# Patient Record
Sex: Female | Born: 1935 | Race: White | Hispanic: No | Marital: Married | Smoking: Never smoker
Health system: Southern US, Community
[De-identification: ages and names within clinical notes are randomized; demographics above are authoritative.]

## PROBLEM LIST (undated history)

## (undated) DIAGNOSIS — I4891 Unspecified atrial fibrillation: Secondary | ICD-10-CM

## (undated) DIAGNOSIS — E785 Hyperlipidemia, unspecified: Secondary | ICD-10-CM

## (undated) DIAGNOSIS — K219 Gastro-esophageal reflux disease without esophagitis: Secondary | ICD-10-CM

## (undated) DIAGNOSIS — C50919 Malignant neoplasm of unspecified site of unspecified female breast: Secondary | ICD-10-CM

## (undated) HISTORY — DX: Malignant neoplasm of unspecified site of unspecified female breast: C50.919

## (undated) HISTORY — DX: Hyperlipidemia, unspecified: E78.5

## (undated) HISTORY — DX: Gastro-esophageal reflux disease without esophagitis: K21.9

## (undated) HISTORY — PX: PARTIAL HYSTERECTOMY: SHX80

## (undated) HISTORY — PX: MASTECTOMY: SHX3

## (undated) HISTORY — DX: Unspecified atrial fibrillation: I48.91

---

## 2001-12-11 ENCOUNTER — Ambulatory Visit (HOSPITAL_COMMUNITY): Admission: RE | Admit: 2001-12-11 | Discharge: 2001-12-11 | Payer: Self-pay | Admitting: Specialist

## 2002-01-13 ENCOUNTER — Ambulatory Visit (HOSPITAL_COMMUNITY): Admission: RE | Admit: 2002-01-13 | Discharge: 2002-01-13 | Payer: Self-pay | Admitting: Specialist

## 2005-07-14 ENCOUNTER — Ambulatory Visit: Payer: Self-pay | Admitting: Cardiology

## 2006-08-17 ENCOUNTER — Encounter: Payer: Self-pay | Admitting: Cardiology

## 2006-10-17 ENCOUNTER — Encounter: Payer: Self-pay | Admitting: Cardiology

## 2007-01-25 ENCOUNTER — Encounter: Payer: Self-pay | Admitting: Cardiology

## 2007-02-05 ENCOUNTER — Ambulatory Visit: Payer: Self-pay | Admitting: Cardiology

## 2007-05-07 ENCOUNTER — Encounter: Payer: Self-pay | Admitting: Cardiology

## 2007-06-11 ENCOUNTER — Encounter: Payer: Self-pay | Admitting: Physician Assistant

## 2007-06-11 ENCOUNTER — Ambulatory Visit: Payer: Self-pay | Admitting: Cardiology

## 2007-06-20 ENCOUNTER — Ambulatory Visit: Payer: Self-pay | Admitting: Cardiology

## 2007-06-26 ENCOUNTER — Ambulatory Visit: Payer: Self-pay | Admitting: Cardiology

## 2007-07-03 ENCOUNTER — Ambulatory Visit: Payer: Self-pay | Admitting: Cardiology

## 2007-07-17 ENCOUNTER — Ambulatory Visit: Payer: Self-pay | Admitting: Cardiology

## 2007-07-30 ENCOUNTER — Ambulatory Visit: Payer: Self-pay | Admitting: Cardiology

## 2007-09-17 ENCOUNTER — Ambulatory Visit: Payer: Self-pay | Admitting: Cardiology

## 2007-10-16 ENCOUNTER — Ambulatory Visit: Payer: Self-pay | Admitting: Cardiology

## 2007-11-12 ENCOUNTER — Ambulatory Visit: Payer: Self-pay | Admitting: Cardiology

## 2008-01-28 ENCOUNTER — Encounter: Payer: Self-pay | Admitting: Cardiology

## 2008-06-23 ENCOUNTER — Ambulatory Visit: Payer: Self-pay | Admitting: Cardiology

## 2008-07-07 ENCOUNTER — Ambulatory Visit: Payer: Self-pay | Admitting: Cardiology

## 2008-07-17 ENCOUNTER — Ambulatory Visit: Payer: Self-pay | Admitting: Cardiology

## 2008-08-14 ENCOUNTER — Ambulatory Visit: Payer: Self-pay | Admitting: Cardiology

## 2008-09-04 ENCOUNTER — Ambulatory Visit: Payer: Self-pay | Admitting: Cardiology

## 2008-09-21 ENCOUNTER — Encounter: Payer: Self-pay | Admitting: *Deleted

## 2008-09-24 DIAGNOSIS — I4891 Unspecified atrial fibrillation: Secondary | ICD-10-CM

## 2008-09-24 DIAGNOSIS — E785 Hyperlipidemia, unspecified: Secondary | ICD-10-CM

## 2008-09-28 ENCOUNTER — Ambulatory Visit: Payer: Self-pay | Admitting: Cardiology

## 2008-09-28 DIAGNOSIS — Z8679 Personal history of other diseases of the circulatory system: Secondary | ICD-10-CM | POA: Insufficient documentation

## 2008-09-30 ENCOUNTER — Encounter: Payer: Self-pay | Admitting: Cardiology

## 2008-10-02 ENCOUNTER — Ambulatory Visit: Payer: Self-pay | Admitting: Cardiology

## 2008-10-02 LAB — CONVERTED CEMR LAB: POC INR: 2.6

## 2008-10-08 ENCOUNTER — Encounter (INDEPENDENT_AMBULATORY_CARE_PROVIDER_SITE_OTHER): Payer: Self-pay | Admitting: *Deleted

## 2008-10-08 ENCOUNTER — Telehealth: Payer: Self-pay | Admitting: Cardiology

## 2008-10-09 ENCOUNTER — Ambulatory Visit: Payer: Self-pay | Admitting: Cardiology

## 2008-10-30 ENCOUNTER — Ambulatory Visit: Payer: Self-pay | Admitting: Cardiology

## 2008-10-30 LAB — CONVERTED CEMR LAB: POC INR: 2.6

## 2008-11-24 ENCOUNTER — Ambulatory Visit: Payer: Self-pay | Admitting: Cardiology

## 2008-11-24 ENCOUNTER — Encounter: Payer: Self-pay | Admitting: Cardiology

## 2009-07-01 ENCOUNTER — Ambulatory Visit: Payer: Self-pay | Admitting: Cardiology

## 2009-07-02 ENCOUNTER — Encounter: Payer: Self-pay | Admitting: Cardiology

## 2009-07-08 ENCOUNTER — Encounter: Payer: Self-pay | Admitting: Cardiology

## 2009-07-08 ENCOUNTER — Encounter (INDEPENDENT_AMBULATORY_CARE_PROVIDER_SITE_OTHER): Payer: Self-pay | Admitting: *Deleted

## 2009-07-16 ENCOUNTER — Ambulatory Visit: Payer: Self-pay | Admitting: Cardiology

## 2009-07-16 LAB — CONVERTED CEMR LAB: POC INR: 2.3

## 2009-08-13 ENCOUNTER — Encounter: Payer: Self-pay | Admitting: Cardiology

## 2009-08-17 ENCOUNTER — Telehealth (INDEPENDENT_AMBULATORY_CARE_PROVIDER_SITE_OTHER): Payer: Self-pay | Admitting: *Deleted

## 2009-08-24 ENCOUNTER — Ambulatory Visit: Payer: Self-pay | Admitting: Cardiology

## 2009-08-24 LAB — CONVERTED CEMR LAB: POC INR: 2.5

## 2009-09-21 ENCOUNTER — Ambulatory Visit: Payer: Self-pay | Admitting: Cardiology

## 2009-09-21 LAB — CONVERTED CEMR LAB: POC INR: 2.9

## 2009-10-01 ENCOUNTER — Ambulatory Visit: Payer: Self-pay | Admitting: Cardiology

## 2009-10-12 ENCOUNTER — Ambulatory Visit: Payer: Self-pay | Admitting: Cardiology

## 2009-10-15 ENCOUNTER — Ambulatory Visit: Payer: Self-pay | Admitting: Cardiology

## 2009-10-15 ENCOUNTER — Encounter: Payer: Self-pay | Admitting: Cardiology

## 2009-10-19 ENCOUNTER — Ambulatory Visit: Payer: Self-pay | Admitting: Cardiology

## 2009-11-09 ENCOUNTER — Encounter: Payer: Self-pay | Admitting: Cardiology

## 2009-11-09 ENCOUNTER — Ambulatory Visit: Payer: Self-pay | Admitting: Cardiology

## 2009-11-17 ENCOUNTER — Encounter: Payer: Self-pay | Admitting: Physician Assistant

## 2009-11-17 ENCOUNTER — Ambulatory Visit: Payer: Self-pay | Admitting: Cardiology

## 2009-11-17 DIAGNOSIS — F411 Generalized anxiety disorder: Secondary | ICD-10-CM | POA: Insufficient documentation

## 2009-11-23 ENCOUNTER — Telehealth: Payer: Self-pay | Admitting: Physician Assistant

## 2009-11-25 ENCOUNTER — Encounter: Payer: Self-pay | Admitting: Cardiology

## 2010-03-08 NOTE — Assessment & Plan Note (Signed)
Summary: atrial fib  --agh   Visit Type:  Follow-up Primary Provider:  Dr. Pleas Koch  CC:  Atrial Fibrillation.  History of Present Illness: The H. and was added to my schedule today to discuss atrial fibrillation. She thinks she was in this rhythm most of the weekend. She fell it this morning but upon arrival here her EKG is sinus rhythm. She felt her heart rate up into the 120s and it was irregular. She feels uncomfortable with this and slightly lightheaded. She has not had chest pressure, neck or arm discomfort. She has not had any new shortness of breath, PND or orthopnea. She called the primary doctor on call and did take an extra Cardizem. This is perhaps the third or fourth episode in the last several months and this year she has had more than she has had a long time.  Preventive Screening-Counseling & Management  Alcohol-Tobacco     Smoking Status: quit     Year Quit: 1958  Current Medications (verified): 1)  Mag-Ox 400 400 Mg Tabs (Magnesium Oxide) .... Two Times A Day 2)  Benefiber  Powd (Wheat Dextrin) .Marland Kitchen.. 1 Tbsp Once A Day 3)  Ra Calcium/iron/vitamin D 600-18-125 Mg-Mg-Unit Tabs (Calcium-Vitamin D-Iron) .... Two Times A Day 4)  Multivitamins   Tabs (Multiple Vitamin) .... Once Daily 5)  Glucosamine-Chondroitin  Caps (Glucosamine-Chondroit-Vit C-Mn) .... Two Times A Day 6)  Biotin 1000 Mcg Tabs (Biotin) .... Two Times A Day 7)  Fish Oil 1000 Mg Caps (Omega-3 Fatty Acids) .... Two Tablets in The Morning / Two Tablets in The Evening 8)  Cortizone-10 1 % Oint (Hydrocortisone) .... Two Times A Day 9)  Clobetasol Propionate 0.05 % Crea (Clobetasol Propionate) .... Apply Topically Weekly 10)  Amitriptyline Hcl 10 Mg Tabs (Amitriptyline Hcl) .... At Bedtime 11)  Warfarin Sodium 5 Mg Tabs (Warfarin Sodium) .... Use As Directed By Anticoagulation Clinic 12)  Atenolol 50 Mg Tabs (Atenolol) .... Take One Tablet By Mouth Twice A Day 13)  Zocor 20 Mg Tabs (Simvastatin) .... Take 1/2 Tab At  Bedtime 14)  Diazepam 2 Mg Tabs (Diazepam) .... Take 1 Tablet By Mouth Once A Day As Needed 15)  Diltiazem Hcl 30 Mg Tabs (Diltiazem Hcl) .... Take One Tab If Needed and Repeat in 1 Hour If Needed 16)  Chlorhexidine Gluconate 0.12 % Soln (Chlorhexidine Gluconate) .... Take As Directed 17)  Cardizem Cd 120 Mg Xr24h-Cap (Diltiazem Hcl Coated Beads) .... Take 1 Tablet By Mouth Once A Day(Patient Hasn't Started This) 18)  Cardizem Cd 120 Mg Xr24h-Cap (Diltiazem Hcl Coated Beads) .... Take 1 Tablet By Mouth Once A Day  Allergies (verified): 1)  ! Epinephrine 2)  ! Dicyclomine 3)  Sulfa 4)  * Adrenalin (Epinephrine Hcl) 5)  * Oscal - Calcium 6)  Sulfamethoxazole (Sulfamethoxazole) 7)  * Oscal  Comments:  Nurse/Medical Assistant: The patient's medication list and allergies were reviewed with the patient and were updated in the Medication and Allergy Lists.  Past History:  Past Medical History: Reviewed history from 09/24/2008 and no changes required. HYPERLIPIDEMIA-MIXED (ICD-272.4) ATRIAL FIBRILLATION (ICD-427.31) GERD  Review of Systems       As stated in the HPI and negative for all other systems.   Vital Signs:  Patient profile:   77 year old female Height:      67.5 inches Weight:      164 pounds O2 Sat:      98 % on Room air Pulse (ortho):   71 / minute BP  sitting:   118 / 78  (left arm) Cuff size:   regular  Vitals Entered By: Georgina Peer (October 12, 2009 9:37 AM)  O2 Flow:  Room air  Physical Exam  General:  Well developed, well nourished, in no acute distress. Head:  normocephalic and atraumatic Eyes:  PERRLA/EOM intact; conjunctiva and lids normal. Mouth:  Teeth, gums and palate normal. Oral mucosa normal. Neck:  Neck supple, no JVD. No masses, thyromegaly or abnormal cervical nodes. Chest Wall:  no deformities or breast masses noted Lungs:  Clear bilaterally to auscultation and percussion. Abdomen:  Bowel sounds positive; abdomen soft and non-tender  without masses, organomegaly, or hernias noted. No hepatosplenomegaly. Msk:  Back normal, normal gait. Muscle strength and tone normal. Extremities:  No clubbing or cyanosis. Neurologic:  Alert and oriented x 3. Skin:  Intact without lesions or rashes. Cervical Nodes:  no significant adenopathy Inguinal Nodes:  no significant adenopathy Psych:  Normal affect.   Detailed Cardiovascular Exam  Neck    Carotids: Carotids full and equal bilaterally without bruits.      Neck Veins: Normal, no JVD.    Heart    Inspection: no deformities or lifts noted.      Palpation: normal PMI with no thrills palpable.      Auscultation: regular rate and rhythm, S1, S2 without murmurs, rubs, gallops, or clicks.    Vascular    Abdominal Aorta: no palpable masses, pulsations, or audible bruits.      Femoral Pulses: normal femoral pulses bilaterally.      Pedal Pulses: normal pedal pulses bilaterally.      Radial Pulses: normal radial pulses bilaterally.      Peripheral Circulation: no clubbing, cyanosis, or edema noted with normal capillary refill.     EKG  Procedure date:  10/12/2009  Findings:      Atrial fibrillation, axis within normal limits, intervals within normal limits, no acute ST-T wave changes  Impression & Recommendations:  Problem # 1:  ATRIAL FIBRILLATION (ICD-427.31) We had a long discussion about this. The next decision point may need to be whether the patient wants to start an antiarrhythmic. Her TSH was done in May and I reviewed this it was normal. She has not had a nuclear study or echocardiogram since 2008 here.  She doesn't think she's had any in [place].  I will arrange for an echocardiogram. I will arrange for her to come back and see Dr. Dannielle Burn before she goes back to [place]. We discussed strategies of taking it next to Cardizem and perhaps a Valium when she's having the symptoms. She remains on anticoagulation. Orders: EKG w/ Interpretation (93000)  Problem # 2:   TRANSIENT ISCHEMIC ATTACKS, HX OF (ICD-V12.50) She has had no recurrent neurologic symptoms and remains on anticoagulation.  Other Orders: 2-D Echocardiogram (2D Echo)  Patient Instructions: 1)  Follow up with Dr. Lutricia Feil on Wed, November 17, 2009 at 9:30 am. 2)  Your physician has requested that you have an echocardiogram.  Echocardiography is a painless test that uses sound waves to create images of your heart. It provides your doctor with information about the size and shape of your heart and how well your heart's chambers and valves are working.  This procedure takes approximately one hour. There are no restrictions for this procedure.

## 2010-03-08 NOTE — Progress Notes (Signed)
Summary: [facility]  Phone Note Call from Patient Call back at [phone]   Summary of Call: Pt states she recently saw Mr. Jacqualine Mau. She states they discussed increasing her diltiazem but she wanted to hold off on this. She states since then she went to the beach and started having more frequent episodes of irregular heartbeats. She states she wants to talk to Gene about this before she goes to [place]. She said that he told her to call anytiime she has a problem.  Initial call taken by: Gurney Maxin, RN, BSN,  November 23, 2009 4:26 PM  Follow-up for Phone Call        I called pt...she agreed to increase Cardizem to 180 mg once daily. therefore, please call in new prescription for this, #30, 6 refills, as well as new prescription for Diltiazem 30 mg as needed, #30, 3 refills...also, she would like [place] to cancel the prescription for Cardizem 120 mg once daily, which apparently has already been filled.   thanks Follow-up by: Maryjane Hurter, PA-C,  November 24, 2009 12:50 PM     Appended Document: [facility] Left message to call back on voicemail.  Appended Document: [facility] Pt would like prescription sent to [facility] in [place]. She also needs refill for 30mg  as needed tablets   Clinical Lists Changes  Medications: Changed medication from CARDIZEM CD 120 MG XR24H-CAP (DILTIAZEM HCL COATED BEADS) Take 1 tablet by mouth once a day to DILTIAZEM HCL ER BEADS 180 MG XR24H-CAP (DILTIAZEM HCL ER BEADS) Take one capsule by mouth daily - Signed Rx of DILTIAZEM HCL 30 MG TABS (DILTIAZEM HCL) take one tab if needed and repeat in 1 hour if needed;  #30 x 2;  Signed;  Entered by: Gurney Maxin, RN, BSN;  Authorized by: Maryjane Hurter, PA-C;  Method used: Electronically to [facility] [place] [address] [place], [place], [place]  [address], Ph: [phone], Fax: [phone] Rx of DILTIAZEM HCL ER BEADS 180 MG XR24H-CAP (DILTIAZEM HCL ER BEADS) Take one capsule by mouth daily;  #90 x 3;  Signed;   Entered by: Gurney Maxin, RN, BSN;  Authorized by: Maryjane Hurter, PA-C;  Method used: Electronically to [facility] [place] [address] [place], [place], [place]  [address], Ph: [phone], Fax: [phone]    Prescriptions: DILTIAZEM HCL ER BEADS 180 MG XR24H-CAP (DILTIAZEM HCL ER BEADS) Take one capsule by mouth daily  #90 x 3   Entered by:   Gurney Maxin, RN, BSN   Authorized by:   Maryjane Hurter, PA-C   Signed by:   Gurney Maxin, RN, BSN on 11/25/2009   Method used:   Electronically to        [facility] (retail)       [address], [place]  [address]       Ph: [phone]       Fax: [phone]   RxID:   [id number] DILTIAZEM HCL 30 MG TABS (DILTIAZEM HCL) take one tab if needed and repeat in 1 hour if needed  #30 x 2   Entered by:   Gurney Maxin, RN, BSN   Authorized by:   Maryjane Hurter, PA-C   Signed by:   Gurney Maxin, RN, BSN on 11/25/2009   Method used:   Electronically to        [facility] [place]* (retail)  [place] [address]       [place], [place]  [address]       Ph: [phone]       Fax: [phone]   RxID:   [id number]

## 2010-03-08 NOTE — Medication Information (Signed)
Summary: ccr-lr  Anticoagulant Therapy  Managed by: Edrick Oh, RN PCP: Dr. Nadyne Coombes MD: Domenic Polite MD, Mikeal Hawthorne Indication 1: Atrial Fibrillation (ICD-427.31) Lab Used: [place] Site: [facility]  Dietary changes: no    Health status changes: no    Bleeding/hemorrhagic complications: no    Recent/future hospitalizations: no    Any changes in medication regimen? no    Recent/future dental: no  Any missed doses?: yes     Details: missed 2 doses  Is patient compliant with meds? yes       Allergies: 1)  ! Epinephrine 2)  ! Dicyclomine 3)  Sulfa 4)  * Adrenalin (Epinephrine Hcl) 5)  * Oscal - Calcium 6)  Sulfamethoxazole (Sulfamethoxazole) 7)  * Oscal  Anticoagulation Management History:      The patient is taking warfarin and comes in today for a routine follow up visit.  Positive risk factors for bleeding include an age of 3 years or older.  The bleeding index is 'intermediate risk'.  Positive CHADS2 values include Age > 17 years old.  The start date was 06/11/2007.  Anticoagulation responsible provider: Domenic Polite MD, Mikeal Hawthorne.  Exp: 10/11.    Anticoagulation Management Assessment/Plan:      The patient's current anticoagulation dose is Warfarin sodium 5 mg tabs: Use as directed by Anticoagulation Clinic.  The target INR is 2 - 3.  The next INR is due 11/09/2009.  Anticoagulation instructions were given to patient.  Results were reviewed/authorized by Edrick Oh, RN.         Prior Anticoagulation Instructions: INR 2.9 Continue coumadin 5mg  once daily except 2.5mg  on Tuesdays and Fridays  Current Anticoagulation Instructions: INR 1.7 Take 1 1/2 tablets tonight then resume 1 tablet once daily except 1/2 tablet on Tuesdays and Fridays

## 2010-03-08 NOTE — Letter (Signed)
Summary: Risk analyst at [facility]. [address]   [place], [place] [address]   Phone: [phone]  Fax: [phone]        July 08, 2009 MRN: [id number]    Marissa Garza [place],   [address]    Dear [name],  Your test ordered by Rande Lawman has been reviewed by your physician (or physician assistant) and was found to be normal or stable. Your physician (or physician assistant) felt no changes were needed at this time.  ____ Echocardiogram  ____ Cardiac Stress Test  __X__ Lab Work  ____ Peripheral vascular study of arms, legs or neck  ____ CT scan or X-ray  ____ Lung or Breathing test  ____ Other:   Thank you.   Gurney Maxin, RN, BSN    Bryon Lions, M.D., F.A.C.C. Maceo Pro, M.D., F.A.C.C. Cammy Copa, M.D., F.A.C.C. Vonda Antigua, M.D., F.A.C.C. Vita Barley, M.D., F.A.C.C. Mare Ferrari, M.D., F.A.C.C. Emi Belfast, PA-C

## 2010-03-08 NOTE — Progress Notes (Signed)
Summary: AF episode x 2        Phone Note Call from Patient   Summary of Call: Had one episode of AF on Friday and again last p.m.  Episode on Friday - did short acting atenolol x 2, one hour apart - did not help.  She then took her regular daily Atenolol 2 hours later - at 2:00a.m./ 6 a..m  and 12 noon before feeling normal again .   Episode last pm - took regular dosage of Atenolol and that helped.   Stated she did go out to [place] where she had coffee on Friday, but thought she had decaf.    Lovina Reach, LPN  July 12, 624THL [id number] AM        Follow-up for Phone Call        should be short acting cardizem, not atenolol. Suggest either she adds low dose Cardizem CD 12mg  by mouth qdaily OR start digoxin 0.25 mg Po qdaily with dig level in 10 days.  Follow-up by: Terald Sleeper, MD, [facility],  August 17, 2009 4:10 PM  Additional Follow-up for Phone Call Additional follow up Details #1::        Clarify above.  You do mean Cardizem CD "120mg " ? Lovina Reach, LPN  July 13, 624THL 624THL AM   Spoke with Pamala Hurry this morning, she did use short acting Cardizem, not Atenolol.  Stated that it did not work though.   Lovina Reach, LPN  July 14, 624THL 624THL AM     Additional Follow-up for Phone Call Additional follow up Details #2::    Yes add low dose Cardizem CD 120 mg Po qdaily.  Follow-up by: Terald Sleeper, MD, [facility],  August 20, 2009 11:26 AM  Additional Follow-up for Phone Call Additional follow up Details #3:: Details for Additional Follow-up Action Taken: Left message to return call.  Lovina Reach, LPN  July 15, 624THL 624THL AM   Patient notified.   Will send new med to [facility].  Pt. also wanted to be seen for f/u before she goes back to [place].  OV scheduled for 8/26.  Patient verbalized understanding.   New/Updated Medications: CARDIZEM CD 120 MG XR24H-CAP (DILTIAZEM HCL COATED BEADS) Take 1 tablet by mouth once a day Prescriptions: CARDIZEM CD 120 MG XR24H-CAP (DILTIAZEM HCL COATED BEADS) Take 1  tablet by mouth once a day  #30 x 6   Entered by:   Lovina Reach, LPN   Authorized by:   Terald Sleeper, MD, [facility]   Signed by:   Lovina Reach, LPN on [id number]   Method used:   Electronically to        [facility]* (retail)       [address]       [place], [place]  [address]       Ph: [phone]       Fax: [phone]   RxID:   [id number]

## 2010-03-08 NOTE — Medication Information (Signed)
Summary: ccr-lr  Anticoagulant Therapy  Managed by: Edrick Oh, RN PCP: none Supervising MD: Percival Spanish MD, Jeneen Rinks Indication 1: Atrial Fibrillation (ICD-427.31) Lab Used: [place] Site: [place] INR POC 2.9  Dietary changes: no    Health status changes: no    Bleeding/hemorrhagic complications: no    Recent/future hospitalizations: no    Any changes in medication regimen? no    Recent/future dental: no  Any missed doses?: no       Is patient compliant with meds? yes       Allergies: 1)  ! Epinephrine 2)  ! Dicyclomine 3)  Sulfa 4)  * Adrenalin (Epinephrine Hcl) 5)  * Oscal - Calcium 6)  Sulfamethoxazole (Sulfamethoxazole) 7)  * Oscal  Anticoagulation Management History:      The patient is taking warfarin and comes in today for a routine follow up visit.  Positive risk factors for bleeding include an age of 34 years or older.  The bleeding index is 'intermediate risk'.  Positive CHADS2 values include Age > 62 years old.  The start date was 06/11/2007.  Anticoagulation responsible provider: Percival Spanish MD, Jeneen Rinks.  INR POC: 2.9.  Cuvette Lot#: [id number].  Exp: 10/11.    Anticoagulation Management Assessment/Plan:      The patient's current anticoagulation dose is Warfarin sodium 5 mg tabs: Use as directed by Anticoagulation Clinic.  The target INR is 2 - 3.  The next INR is due 10/19/2009.  Anticoagulation instructions were given to patient.  Results were reviewed/authorized by Edrick Oh, RN.  She was notified by Edrick Oh RN.         Prior Anticoagulation Instructions: INR 2.5 Contiinue coumadin 5mg  once daily except 2.5mg  on Tuesdays and Fridays  Current Anticoagulation Instructions: INR 2.9 Continue coumadin 5mg  once daily except 2.5mg  on Tuesdays and Fridays

## 2010-03-08 NOTE — Miscellaneous (Signed)
Summary: rx - diazepam  Clinical Lists Changes  Medications: Rx of DIAZEPAM 2 MG TABS (DIAZEPAM) Take 1 tablet by mouth once a day as needed;  #90 x 0;  Signed;  Entered by: Lovina Reach, LPN;  Authorized by: Terald Sleeper, MD, [facility];  Method used: Print then Give to Patient    Prescriptions: DIAZEPAM 2 MG TABS (DIAZEPAM) Take 1 tablet by mouth once a day as needed  #90 x 0   Entered by:   Lovina Reach, LPN   Authorized by:   Terald Sleeper, MD, [facility]   Signed by:   Lovina Reach, LPN on [id number]   Method used:   Print then Give to Patient   RxID:   [id number]

## 2010-03-08 NOTE — Assessment & Plan Note (Signed)
Summary: 6 MO FU & CCR   Visit Type:  Follow-up Primary Provider:  none   History of Present Illness: the patient is a 77 year old female with a history of paroxysmal atrial fibrillation.  The patient is on chronic Coumadin therapy.  She is very functional.  She has a prior history of CVA with no residual neurological deficits.  Since her last office visit she reports a couple of episodes off what appears to be short runs of atrial fibrillation.  She reports experiencing a fluttering in her chest on two separate occasions to go lasting about 30 minutes.  There was no associated shortness of breath chest pain or diaphoresis.  Otherwise the patient has been doing well from a cardiovascular standpoint with no reports of presyncope or syncope.  The patient did not have any recent lab work done and she is due for PT/INR in the office today.  Preventive Screening-Counseling & Management  Alcohol-Tobacco     Smoking Status: quit     Year Quit: 1958  Current Medications (verified): 1)  Mag-Ox 400 400 Mg Tabs (Magnesium Oxide) .... Two Times A Day 2)  Benefiber  Powd (Wheat Dextrin) .Marland Kitchen.. 1 Tbsp Once A Day 3)  Ra Calcium/iron/vitamin D 600-18-125 Mg-Mg-Unit Tabs (Calcium-Vitamin D-Iron) .... Two Times A Day 4)  Multivitamins   Tabs (Multiple Vitamin) .... Once Daily 5)  Glucosamine-Chondroitin  Caps (Glucosamine-Chondroit-Vit C-Mn) .... Two Times A Day 6)  Biotin 1000 Mcg Tabs (Biotin) .... Two Times A Day 7)  Fish Oil 1000 Mg Caps (Omega-3 Fatty Acids) .... Two Tablets in The Morning / Two Tablets in The Evening 8)  Acidophilus  Caps (Lactobacillus) .... Once Daily 9)  Cortizone-10 1 % Oint (Hydrocortisone) .... Two Times A Day 10)  Clobetasol Propionate 0.05 % Crea (Clobetasol Propionate) .... Twice A Week 11)  Premarin 0.625 Mg/gm Crea (Estrogens, Conjugated) .... Weekly 12)  Amitriptyline Hcl 10 Mg Tabs (Amitriptyline Hcl) .... At Bedtime 13)  Warfarin Sodium 5 Mg Tabs (Warfarin Sodium)  .... Use As Directed By Anticoagulation Clinic 14)  Atenolol 50 Mg Tabs (Atenolol) .... Take One Tablet By Mouth Twice A Day 15)  Zocor 20 Mg Tabs (Simvastatin) .... Take 1 Tablet By Mouth Once A Day 16)  Diazepam 2 Mg Tabs (Diazepam) .... Take 1 Tablet By Mouth Once A Day As Needed 17)  Diltiazem Hcl 30 Mg Tabs (Diltiazem Hcl) .... Take One Tab If Needed and Repeat in 1 Hour If Needed 18)  Chlorhexidine Gluconate 0.12 % Soln (Chlorhexidine Gluconate) .... Take As Directed  Allergies: 1)  ! Epinephrine 2)  ! Dicyclomine 3)  Sulfa 4)  * Adrenalin (Epinephrine Hcl) 5)  * Oscal - Calcium 6)  Sulfamethoxazole (Sulfamethoxazole) 7)  * Oscal  Comments:  Nurse/Medical Assistant: The patient's medications and allergies were reviewed with the patient and were updated in the Medication and Allergy Lists. List reviewed.  Past History:  Past Medical History: Last updated: 2008/10/13 HYPERLIPIDEMIA-MIXED (ICD-272.4) ATRIAL FIBRILLATION (ICD-427.31) GERD  Family History: Last updated: 2008-10-13 Pts. mother died of heart attack at age 12. Father died of stroke at age 57. One of the pts. brother died at age 56 of unknown reasons. Family History of Coronary Artery Disease:  Family History of CVA or Stroke:  Family History of Sudden Cardiac Death:   Social History: Last updated: 10-13-08 she has four children, one of whom is deceased from unknown reasons. Pt. lives half of the year in [place], [place] and the other half here in [place].  Retired  Married  Tobacco Use - No.  Alcohol Use - no  Risk Factors: Smoking Status: quit (07/01/2009)  Social History: Smoking Status:  quit  Review of Systems       The patient complains of palpitations.  The patient denies fatigue, malaise, fever, weight gain/loss, vision loss, decreased hearing, hoarseness, chest pain, shortness of breath, prolonged cough, wheezing, sleep apnea, coughing up blood, abdominal pain, blood in stool, nausea,  vomiting, diarrhea, heartburn, incontinence, blood in urine, muscle weakness, joint pain, leg swelling, rash, skin lesions, headache, fainting, dizziness, depression, anxiety, enlarged lymph nodes, easy bruising or bleeding, and environmental allergies.    Vital Signs:  Patient profile:   77 year old female Height:      67.5 inches Weight:      163 pounds BMI:     25.24 O2 Sat:      98 % Pulse rate:   66 / minute BP sitting:   119 / 73  (left arm) Cuff size:   regular  Vitals Entered By: Georgina Peer (Jul 01, 2009 3:07 PM)  Nutrition Counseling: Patient's BMI is greater than 25 and therefore counseled on weight management options.  Physical Exam  Additional Exam:  General: Well-developed, well-nourished in no distress head: Normocephalic and atraumatic eyes PERRLA/EOMI intact, conjunctiva and lids normal nose: No deformity or lesions mouth normal dentition, normal posterior pharynx neck: Supple, no JVD.  No masses, thyromegaly or abnormal cervical nodes lungs: Normal breath sounds bilaterally without wheezing.  Normal percussion heart: regular rate and rhythm with normal S1 and S2, no S3 or S4.  PMI is normal.  No pathological murmurs abdomen: Normal bowel sounds, abdomen is soft and nontender without masses, organomegaly or hernias noted.  No hepatosplenomegaly musculoskeletal: Back normal, normal gait muscle strength and tone normal pulsus: Pulse is normal in all 4 extremities Extremities: No peripheral pitting edema neurologic: Alert and oriented x 3 skin: Intact without lesions or rashes cervical nodes: No significant adenopathy psychologic: Normal affect    Impression & Recommendations:  Problem # 1:  COUMADIN THERAPY (ICD-V58.61) PT/INR will be checked Orders: T-CBC No Diff [id number]) T-Comprehensive Metabolic Panel (A999333) T-Lipid Profile [id number]) T-TSH [id number]) T-Protime, Auto [id number])  Problem # 2:  TRANSIENT ISCHEMIC ATTACKS, HX OF  (ICD-V12.50) no recurrence the patient will need to stay on lifelong Coumadin in the setting of her chronic paroxysmal atrial fibrillation Orders: T-CBC No Diff [id number]) T-Comprehensive Metabolic Panel (A999333) T-Lipid Profile [id number]) T-TSH [id number])  Problem # 3:  HYPERLIPIDEMIA-MIXED (ICD-272.4) lipid panel and liver function studies will be ordered Her updated medication list for this problem includes:    Zocor 20 Mg Tabs (Simvastatin) .Marland Kitchen... Take 1 tablet by mouth once a day  Orders: T-CBC No Diff [id number]) T-Comprehensive Metabolic Panel (A999333) T-Lipid Profile [id number]) T-TSH [id number])  Problem # 4:  ATRIAL FIBRILLATION (ICD-427.31) the patient appears to have had a couple of episodes of rapid atrial fibrillation.  I have given the patient a prescription for p.r.n. short-acting diltiazem 30 mg p.o. x 1 to be used as needed The following medications were removed from the medication list:    Atenolol 50 Mg Tabs (Atenolol) .Marland Kitchen... Take one tablet by mouth as needed for palpitations    Coumadin 5 Mg Tabs (Warfarin sodium) .Marland Kitchen... Take 1-2 tablet by mouth as directed Her updated medication list for this problem includes:    Warfarin Sodium 5 Mg Tabs (Warfarin sodium) ..... Use as directed by anticoagulation clinic    Atenolol 50 Mg Tabs (Atenolol) .Marland KitchenMarland KitchenMarland KitchenMarland Kitchen  Take one tablet by mouth twice a day  Orders: T-CBC No Diff [id number]) T-Comprehensive Metabolic Panel (A999333) T-Lipid Profile [id number]) T-TSH [id number]) T-Protime, Auto [id number])  Patient Instructions: 1)  Labs 2)  Diltiazem 30mg  x 1 if needed.  May repeat in 1 hour if needed.   3)  Follow up in  6 months  Prescriptions: CHLORHEXIDINE GLUCONATE 0.12 % SOLN (CHLORHEXIDINE GLUCONATE) take as directed  #1 bottle x 1   Entered by:   Lovina Reach, LPN   Authorized by:   Terald Sleeper, MD, [facility]   Signed by:   Lovina Reach, LPN on [id number]   Method used:   Electronically to         [facility] (retail)       [address], [place]  [address]       Ph: [phone]       Fax: [phone]   RxID:   [id number] DIAZEPAM 2 MG TABS (DIAZEPAM) Take 1 tablet by mouth once a day as needed  #90 x 0   Entered by:   Lovina Reach, LPN   Authorized by:   Terald Sleeper, MD, [facility]   Signed by:   Lovina Reach, LPN on [id number]   Method used:   Print then Give to Patient   RxID:   [id number] AMITRIPTYLINE HCL 10 MG TABS (AMITRIPTYLINE HCL) at bedtime  #90 x 0   Entered by:   Lovina Reach, LPN   Authorized by:   Terald Sleeper, MD, [facility]   Signed by:   Lovina Reach, LPN on [id number]   Method used:   Faxed to ...       [facility]             , [place]         Ph:        Fax: [phone]   [id number] PREMARIN 0.625 MG/GM CREA (ESTROGENS, CONJUGATED) weekly  #6 x 1   Entered by:   Lovina Reach, LPN   Authorized by:   Terald Sleeper, MD, [facility]   Signed by:   Lovina Reach, LPN on [id number]   Method used:   Electronically to        [facility]* (retail)       [address], [place]  [address]       Ph: [phone]       Fax: [phone]   RxID:   [id number] WARFARIN SODIUM 5 MG TABS (WARFARIN SODIUM) Use as directed by Anticoagulation Clinic  #90 x 0   Entered by:   Lovina Reach, LPN   Authorized by:   Terald Sleeper, MD, [facility]   Signed by:   Lovina Reach, LPN on [id number]   Method used:   Electronically to        [facility] (retail)       [address],   [address]       Ph: [phone]       Fax: [phone]   RxID:   [id number] ATENOLOL 50 MG TABS (ATENOLOL) Take one tablet by mouth twice a day  #180 x 3   Entered by:   Lovina Reach,  LPN   Authorized by:   Terald Sleeper, MD, [facility]   Signed by:   Lovina Reach, LPN on [id number]   Method used:   Faxed to ...       [facility]order)             , [place]         Ph:        Fax: [phone]   RxID:   [id number] ZOCOR 20 MG TABS (SIMVASTATIN) Take 1 tablet by mouth once a day  #90 x 3   Entered by:   Lovina Reach, LPN   Authorized by:   Terald Sleeper, MD, [facility]   Signed by:   Lovina Reach, LPN on [id number]   Method used:   Faxed to ...       [facility]order)             , [place]         Ph:        Fax: [phone]   [id number] DILTIAZEM HCL 30 MG TABS (DILTIAZEM HCL) take one tab if needed and repeat in 1 hour if needed  #30 x 1   Entered by:   Lovina Reach, LPN   Authorized by:   Terald Sleeper, MD, [facility]   Signed by:   Lovina Reach, LPN on [id number]   Method used:   Electronically to        [facility] (retail)       [address]       [place], [place]  [address]       Ph: [phone]       Fax: [phone]   RxID:   [id number]

## 2010-03-08 NOTE — Medication Information (Signed)
Summary: RX Folder/ [facility]  RX Folder/ [facility]   Imported By: Bartholomew Boards 11/29/2009 16:09:12  _____________________________________________________________________  External Attachment:    Type:   Image     Comment:   External Document

## 2010-03-08 NOTE — Miscellaneous (Signed)
Summary: rx - prosthetic bra  Clinical Lists Changes  Medications: Added new medication of * PROSTHETIC BRA use as directed - Signed Rx of PROSTHETIC BRA use as directed;  #2 x 0;  Signed;  Entered by: Lovina Reach, LPN;  Authorized by: Terald Sleeper, MD, [facility];  Method used: Faxed to [facility] [place], [place], [address], [place]  [address], Ph: [phone], Fax: [phone]    Prescriptions: PROSTHETIC BRA use as directed  #2 x 0   Entered by:   Lovina Reach, LPN   Authorized by:   Terald Sleeper, MD, [facility]   Signed by:   Lovina Reach, LPN on [id number]   Method used:   Faxed to ...       [facility] (retail)       [place] [address]       [place],   [address]       Ph: [phone]       Fax: [phone]   RxID:   [id number]

## 2010-03-08 NOTE — Assessment & Plan Note (Signed)
Summary: rov --agh   Visit Type:  Follow-up Primary Duaa Stelzner:  [place]   History of Present Illness: patient is a 77 year old female with a history of proximal atrial fibrillation.  She reports an episode some time ago of prolonged palpitations.  She took short-acting diltiazem x 2.  However this is not controlling her rapid palpitations she took some additional atenolol and ultimately his symptoms resolved.  She denied any chest pain or shortness of breath.  Apart from this single episode the patient has been doing well.  She denies any orthopnea PND presyncope or syncope.  Her EKG today in the office is within normal limits.  She had laboratory work done with renal function stable and normal TSH and normal CBC.  Her LDL is 88-mg percent triglycerides 107 total cholesterol 156.  Liver function tests were within normal limits  Preventive Screening-Counseling & Management  Alcohol-Tobacco     Smoking Status: quit     Year Quit: 1958  Current Medications (verified): 1)  Mag-Ox 400 400 Mg Tabs (Magnesium Oxide) .... Two Times A Day 2)  Benefiber  Powd (Wheat Dextrin) .Marland Kitchen.. 1 Tbsp Once A Day 3)  Ra Calcium/iron/vitamin D 600-18-125 Mg-Mg-Unit Tabs (Calcium-Vitamin D-Iron) .... Two Times A Day 4)  Multivitamins   Tabs (Multiple Vitamin) .... Once Daily 5)  Glucosamine-Chondroitin  Caps (Glucosamine-Chondroit-Vit C-Mn) .... Two Times A Day 6)  Biotin 1000 Mcg Tabs (Biotin) .... Two Times A Day 7)  Fish Oil 1000 Mg Caps (Omega-3 Fatty Acids) .... Two Tablets in The Morning / Two Tablets in The Evening 8)  Cortizone-10 1 % Oint (Hydrocortisone) .... Two Times A Day 9)  Clobetasol Propionate 0.05 % Crea (Clobetasol Propionate) .... Apply Topically Weekly 10)  Amitriptyline Hcl 10 Mg Tabs (Amitriptyline Hcl) .... At Bedtime 11)  Warfarin Sodium 5 Mg Tabs (Warfarin Sodium) .... Use As Directed By Anticoagulation Clinic 12)  Atenolol 50 Mg Tabs (Atenolol) .... Take One Tablet By Mouth Twice A Day 13)   Zocor 20 Mg Tabs (Simvastatin) .... Take 1/2 Tab At Bedtime 14)  Diazepam 2 Mg Tabs (Diazepam) .... Take 1 Tablet By Mouth Once A Day As Needed 15)  Diltiazem Hcl 30 Mg Tabs (Diltiazem Hcl) .... Take One Tab If Needed and Repeat in 1 Hour If Needed 16)  Chlorhexidine Gluconate 0.12 % Soln (Chlorhexidine Gluconate) .... Take As Directed 17)  Cardizem Cd 120 Mg Xr24h-Cap (Diltiazem Hcl Coated Beads) .... Take 1 Tablet By Mouth Once A Day(Patient Hasn't Started This) 18)  Cardizem Cd 120 Mg Xr24h-Cap (Diltiazem Hcl Coated Beads) .... Take 1 Tablet By Mouth Once A Day  Allergies (verified): 1)  ! Epinephrine 2)  ! Dicyclomine 3)  Sulfa 4)  * Adrenalin (Epinephrine Hcl) 5)  * Oscal - Calcium 6)  Sulfamethoxazole (Sulfamethoxazole) 7)  * Oscal  Comments:  Nurse/Medical Assistant: The patient's medication list and allergies were reviewed with the patient and were updated in the Medication and Allergy Lists.  Past History:  Past Medical History: Last updated: October 11, 2008 HYPERLIPIDEMIA-MIXED (ICD-272.4) ATRIAL FIBRILLATION (ICD-427.31) GERD  Family History: Last updated: 10/11/08 Pts. mother died of heart attack at age 32. Father died of stroke at age 27. One of the pts. brother died at age 61 of unknown reasons. Family History of Coronary Artery Disease:  Family History of CVA or Stroke:  Family History of Sudden Cardiac Death:   Social History: Last updated: 10-11-08 she has four children, one of whom is deceased from unknown reasons. Pt. lives half of  the year in [place], [place] and the other half here in [place]. Retired  Married  Tobacco Use - No.  Alcohol Use - no  Risk Factors: Smoking Status: quit (10/01/2009)  Review of Systems       The patient complains of palpitations.  The patient denies fatigue, malaise, fever, weight gain/loss, vision loss, decreased hearing, hoarseness, chest pain, shortness of breath, prolonged cough, wheezing, sleep apnea, coughing up  blood, abdominal pain, blood in stool, nausea, vomiting, diarrhea, heartburn, incontinence, blood in urine, muscle weakness, joint pain, leg swelling, rash, skin lesions, headache, fainting, dizziness, depression, anxiety, enlarged lymph nodes, easy bruising or bleeding, and environmental allergies.    Vital Signs:  Patient profile:   77 year old female Height:      67.5 inches Weight:      165 pounds Pulse rate:   69 / minute BP sitting:   133 / 83  (left arm) Cuff size:   regular  Vitals Entered By: Georgina Peer (October 01, 2009 11:24 AM)  Physical Exam  Additional Exam:  General: Well-developed, well-nourished in no distress head: Normocephalic and atraumatic eyes PERRLA/EOMI intact, conjunctiva and lids normal nose: No deformity or lesions mouth normal dentition, normal posterior pharynx neck: Supple, no JVD.  No masses, thyromegaly or abnormal cervical nodes lungs: Normal breath sounds bilaterally without wheezing.  Normal percussion heart: regular rate and rhythm with normal S1 and S2, no S3 or S4.  PMI is normal.  No pathological murmurs abdomen: Normal bowel sounds, abdomen is soft and nontender without masses, organomegaly or hernias noted.  No hepatosplenomegaly musculoskeletal: Back normal, normal gait muscle strength and tone normal pulsus: Pulse is normal in all 4 extremities Extremities: No peripheral pitting edema neurologic: Alert and oriented x 3 skin: Intact without lesions or rashes cervical nodes: No significant adenopathy psychologic: Normal affect    EKG  Procedure date:  10/02/2009  Findings:      normal sinus rhythm.  Normal EKG.  Heart rate 66 beats/min  Impression & Recommendations:  Problem # 1:  ATRIAL FIBRILLATION (ICD-427.31) the patient had a recurrent episode of atrial fibrillation.  Previously had given her a prescription to start with Cardizem CD 169milligrams p.o. daily.  The patient however was very concerned to start this medication.   She is now willing to start this.  I told her that she still can use short-acting diltiazem if needed. Her updated medication list for this problem includes:    Warfarin Sodium 5 Mg Tabs (Warfarin sodium) ..... Use as directed by anticoagulation clinic    Atenolol 50 Mg Tabs (Atenolol) .Marland Kitchen... Take one tablet by mouth twice a day  Problem # 2:  TRANSIENT ISCHEMIC ATTACKS, HX OF (ICD-V12.50) no recurrence.  The patient is compliant with her anticoagulation therapy.  Problem # 3:  COUMADIN THERAPY (ICD-V58.61) followed by the patient's primary care physician.  Patient Instructions: 1)  Cardizem CD 120mg  daily 2)  Follow up in  6 months Prescriptions: CARDIZEM CD 120 MG XR24H-CAP (DILTIAZEM HCL COATED BEADS) Take 1 tablet by mouth once a day  #30 x 6   Entered by:   Lovina Reach, LPN   Authorized by:   Terald Sleeper, MD, [facility]   Signed by:   Lovina Reach, LPN on [id number]   Method used:   Electronically to        [facility] (retail)       [address]  [place], [place]  [address]       Ph: [phone]       Fax: [phone]   RxID:   [id number]

## 2010-03-08 NOTE — Assessment & Plan Note (Signed)
Summary: F/U BEFORE LEAVING FOR [place]-   Visit Type:  Follow-up Primary Provider:  Dr. Pleas Koch   History of Present Illness: patient returns for early scheduled followup, per Dr. Orlean Patten request.  When last seen, she was in atrial fibrillation by EKG. He noted a recent normal TSH level. He ordered a 2-D echo, which revealed normal LVEF (EF 60%), and mild mitral regurgitation. He suggested that she might consider discussing the option of starting an antiarrhythmic, with Dr. Dannielle Burn.   Since that visit, she had one more episode of tachycardia palpitations, lasting approximately 2 hours, for which she took two short-acting diltiazem tablets, as previously instructed by Dr. Dannielle Burn. She has not had any recurrent episodes. Moreover, she denies any associated chest pain, dyspnea, or pre-syncope with these episodes. She is, however, clearly aware of when she is out of rhythm, and appears to be made more anxious by these recurrent episodes.  Preventive Screening-Counseling & Management  Alcohol-Tobacco     Smoking Status: quit     Year Quit: 1958  Current Medications (verified): 1)  Mag-Ox 400 400 Mg Tabs (Magnesium Oxide) .... Two Times A Day 2)  Benefiber  Powd (Wheat Dextrin) .Marland Kitchen.. 1 Tbsp Once A Day 3)  Ra Calcium/iron/vitamin D 600-18-125 Mg-Mg-Unit Tabs (Calcium-Vitamin D-Iron) .... Two Times A Day 4)  Multivitamins   Tabs (Multiple Vitamin) .... Once Daily 5)  Glucosamine-Chondroitin  Caps (Glucosamine-Chondroit-Vit C-Mn) .... Two Times A Day 6)  Biotin 1000 Mcg Tabs (Biotin) .... Two Times A Day 7)  Fish Oil 1000 Mg Caps (Omega-3 Fatty Acids) .... Two Tablets in The Morning / Two Tablets in The Evening 8)  Cortizone-10 1 % Oint (Hydrocortisone) .... Two Times A Day 9)  Clobetasol Propionate 0.05 % Crea (Clobetasol Propionate) .... Apply Topically Weekly 10)  Amitriptyline Hcl 10 Mg Tabs (Amitriptyline Hcl) .... At Bedtime 11)  Warfarin Sodium 5 Mg Tabs (Warfarin Sodium) .... Use As  Directed By Anticoagulation Clinic 12)  Atenolol 50 Mg Tabs (Atenolol) .... Take One Tablet By Mouth Twice A Day 13)  Zocor 20 Mg Tabs (Simvastatin) .... Take 1/2 Tab At Bedtime 14)  Diazepam 2 Mg Tabs (Diazepam) .... Take 1 Tablet By Mouth Once A Day As Needed 15)  Diltiazem Hcl 30 Mg Tabs (Diltiazem Hcl) .... Take One Tab If Needed and Repeat in 1 Hour If Needed 16)  Chlorhexidine Gluconate 0.12 % Soln (Chlorhexidine Gluconate) .... Take As Directed 17)  Cardizem Cd 120 Mg Xr24h-Cap (Diltiazem Hcl Coated Beads) .... Take 1 Tablet By Mouth Once A Day  Allergies (verified): 1)  ! Epinephrine 2)  ! Dicyclomine 3)  Sulfa 4)  * Adrenalin (Epinephrine Hcl) 5)  * Oscal - Calcium 6)  Sulfamethoxazole (Sulfamethoxazole) 7)  * Oscal  Comments:  Nurse/Medical Assistant: The patient's medication list and allergies were reviewed with the patient and were updated in the Medication and Allergy Lists.  Past History:  Past Medical History: Last updated: 09/24/2008 HYPERLIPIDEMIA-MIXED (ICD-272.4) ATRIAL FIBRILLATION (ICD-427.31) GERD  Review of Systems       No fevers, chills, hemoptysis, dysphagia, melena, hematocheezia, hematuria, rash, claudication, orthopnea, pnd, pedal edema. All other systems negative.   Vital Signs:  Patient profile:   77 year old female Height:      67.5 inches Weight:      165 pounds Pulse rate:   70 / minute BP sitting:   126 / 81  (left arm) Cuff size:   regular  Vitals Entered By: Georgina Peer (November 17, 2009 10:01  AM)  Physical Exam  Additional Exam:  GEN: 77 year old female, sitting upright, in no distress HEENT: NCAT,PERRLA,EOMI NECK: palpable pulses, no bruits; no JVD; no TM LUNGS: CTA bilaterally HEART: RRR (S1S2); no significant murmurs; no rubs; no gallops ABD: soft, NT; intact BS EXT: intact distal pulses; no edema SKIN: warm, dry MUSC: no obvious deformity NEURO: A/O (x3)     EKG  Procedure date:  11/17/2009  Findings:       normal sinus rhythm at 65 bpm; normal axis; no ischemic changes  Impression & Recommendations:  Problem # 1:  ATRIAL FIBRILLATION (ICD-427.31)  patient is currently in normal sinus rhythm. She presents with only one recurrent episode of tachycardia palpitations, since her last office visit. She is on chronic Coumadin, followed by Dr. Pleas Koch. She is on both atenolol and Cardizem for rate/rhythm control. We discussed the option of increasing her diltiazem dose to provide protection later in the evening, when she appears to have these breakthrough episodes. However, she was reluctant to do so, concerned about being on "too much medication". She is also about to leave for [place] later this month, to stay for about 6 months.  We, therefore, agreed to continue the current medication regimen, including use of p.r.n. diltiazem 30 mg tablets for breakthrough episodes. If her symptoms increase in frequency or duration, she is to consult with her cardiologist in [place]. We'll otherwise plan on having her return for a clinic visit in 6 months, upon her return. A prescription for long-acting Cardizem will be renewed.  Problem # 2:  COUMADIN THERAPY (ICD-V58.61)  followed by Dr. Pleas Koch.  Problem # 3:  TRANSIENT ISCHEMIC ATTACKS, HX OF (ICD-V12.50) Assessment: Comment Only  Problem # 4:  ANXIETY DISORDER (ICD-300.00)  Will defer to Dr. Pleas Koch on for medical management recommendations.  Other Orders: EKG w/ Interpretation (93000)  Patient Instructions: 1)  Call any time for severe chest pain 2)  Cardizem CD 120mg  daily 3)  Follow up in  6 months Prescriptions: CARDIZEM CD 120 MG XR24H-CAP (DILTIAZEM HCL COATED BEADS) Take 1 tablet by mouth once a day  #90 x 3   Entered by:   Lovina Reach, LPN   Authorized by:   Terald Sleeper, MD, [facility]   Signed by:   Lovina Reach, LPN on [id number]   Method used:   Electronically to        [facility] (retail)       [address], [place]  [address]       Ph: [phone]       Fax: [phone]   RxIDKQ:[id number]  I have reviewed and approved all prescriptions at the time of the office visit. Maryjane Hurter, PA-C  November 17, 2009 10:38 AM

## 2010-03-08 NOTE — Medication Information (Signed)
Summary: Coumadin Clinic/ [facility]  Coumadin Clinic/ [facility]   Imported By: Bartholomew Boards 07/08/2009 15:43:11  _____________________________________________________________________  External Attachment:    Type:   Image     Comment:   External Document

## 2010-03-08 NOTE — Medication Information (Signed)
Summary: Coumadin Clinic  Anticoagulant Therapy  Managed by: Inactive PCP: Dr. Pleas Koch Supervising MD: Ron Parker MD, Dellis Filbert Indication 1: Atrial Fibrillation (ICD-427.31) Lab Used: [place] Site: [facility]          Comments: Pt is leaving for [place] for 6 months.  She will return in April.  MD in [facility] manages coumadin while she is there.  She will call me when she returns home in April  Allergies: 1)  ! Epinephrine 2)  ! Dicyclomine 3)  Sulfa 4)  * Adrenalin (Epinephrine Hcl) 5)  * Oscal - Calcium 6)  Sulfamethoxazole (Sulfamethoxazole) 7)  * Oscal  Anticoagulation Management History:      Positive risk factors for bleeding include an age of 75 years or older.  The bleeding index is 'intermediate risk'.  Positive CHADS2 values include Age > 39 years old.  The start date was 06/11/2007.  Anticoagulation responsible provider: Ron Parker MD, Dellis Filbert.  Exp: 10/11.    Anticoagulation Management Assessment/Plan:      The patient's current anticoagulation dose is Warfarin sodium 5 mg tabs: Use as directed by Anticoagulation Clinic.  The target INR is 2 - 3.  The next INR is due 12/07/2009.  Anticoagulation instructions were given to patient.  Results were reviewed/authorized by Inactive.         Prior Anticoagulation Instructions: INR 2.3 Continue coumadin 5mg  once daily except 2.5mg  on Tuesdays and Fridays Pt is leaving for [place] for 6 months.  She will return in April.  MD in [facility] manages coumadin while she is there.  She will call me when she returns home in April

## 2010-03-08 NOTE — Medication Information (Signed)
Summary: ccr-r/s from no show  Anticoagulant Therapy  Managed by: Edrick Oh, RN PCP: none Supervising MD: Dannielle Burn MD, Luvenia Heller Indication 1: Atrial Fibrillation (ICD-427.31) Lab Used: [place] Site: [place] INR POC 2.5  Dietary changes: no    Health status changes: no    Bleeding/hemorrhagic complications: no    Recent/future hospitalizations: no    Any changes in medication regimen? no    Recent/future dental: no  Any missed doses?: no       Is patient compliant with meds? yes       Allergies: 1)  ! Epinephrine 2)  ! Dicyclomine 3)  Sulfa 4)  * Adrenalin (Epinephrine Hcl) 5)  * Oscal - Calcium 6)  Sulfamethoxazole (Sulfamethoxazole) 7)  * Oscal  Anticoagulation Management History:      The patient is taking warfarin and comes in today for a routine follow up visit.  Positive risk factors for bleeding include an age of 22 years or older.  The bleeding index is 'intermediate risk'.  Positive CHADS2 values include Age > 46 years old.  The start date was 06/11/2007.  Anticoagulation responsible provider: Dannielle Burn MD, Luvenia Heller.  INR POC: 2.5.  Cuvette Lot#: [id number].  Exp: 10/11.    Anticoagulation Management Assessment/Plan:      The patient's current anticoagulation dose is Warfarin sodium 5 mg tabs: Use as directed by Anticoagulation Clinic.  The target INR is 2 - 3.  The next INR is due 09/21/2009.  Anticoagulation instructions were given to patient.  Results were reviewed/authorized by Edrick Oh, RN.  She was notified by Edrick Oh RN.         Prior Anticoagulation Instructions: INR 2.3 Continue coumadin 5mg  once daily except 2.5mg  on Tuesdays and Fridays  Current Anticoagulation Instructions: INR 2.5 Contiinue coumadin 5mg  once daily except 2.5mg  on Tuesdays and Fridays

## 2010-03-08 NOTE — Medication Information (Signed)
Summary: [facility]  Anticoagulant Therapy  Managed by: Edrick Oh, RN PCP: none Supervising MD: Domenic Polite MD, Mikeal Hawthorne Indication 1: Atrial Fibrillation (ICD-427.31) Lab Used: [place] Site: [place] INR POC 2.3  Dietary changes: no    Health status changes: no    Bleeding/hemorrhagic complications: no    Recent/future hospitalizations: no    Any changes in medication regimen? no    Recent/future dental: no  Any missed doses?: no       Is patient compliant with meds? yes      Comments: Pt back from [place]  Allergies: 1)  ! Epinephrine 2)  ! Dicyclomine 3)  Sulfa 4)  * Adrenalin (Epinephrine Hcl) 5)  * Oscal - Calcium 6)  Sulfamethoxazole (Sulfamethoxazole) 7)  * Oscal  Anticoagulation Management History:      The patient is taking warfarin and comes in today for a routine follow up visit.  Positive risk factors for bleeding include an age of 75 years or older.  The bleeding index is 'intermediate risk'.  Positive CHADS2 values include Age > 29 years old.  The start date was 06/11/2007.  Anticoagulation responsible provider: Domenic Polite MD, Mikeal Hawthorne.  INR POC: 2.3.  Cuvette Lot#: [id number].  Exp: 10/11.    Anticoagulation Management Assessment/Plan:      The patient's current anticoagulation dose is Warfarin sodium 5 mg tabs: Use as directed by Anticoagulation Clinic.  The target INR is 2 - 3.  The next INR is due 08/13/2009.  Anticoagulation instructions were given to patient.  Results were reviewed/authorized by Edrick Oh, RN.  She was notified by Edrick Oh RN.         Prior Anticoagulation Instructions: INR 2.0 today Take coumadin 7.5mg  tonight then resume 5mg  once daily except 2.5mg  on M,W,F Pt is leaving for [facility] for then winter.  She will have coumadin managed there until her return in April.  Will make pt inactive until then.  She will call us whne she returns.  Current Anticoagulation Instructions: INR 2.3 Continue coumadin 5mg  once daily except 2.5mg  on  Tuesdays and Fridays

## 2010-03-08 NOTE — Medication Information (Signed)
Summary: ccr-lr  Anticoagulant Therapy  Managed by: Edrick Oh, RN PCP: Dr. Nadyne Coombes MD: Ron Parker MD, Dellis Filbert Indication 1: Atrial Fibrillation (ICD-427.31) Lab Used: [place] Site: [place] INR POC 2.3  Dietary changes: no    Health status changes: no    Bleeding/hemorrhagic complications: no    Recent/future hospitalizations: no    Any changes in medication regimen? no    Recent/future dental: no  Any missed doses?: no       Is patient compliant with meds? yes       Allergies: 1)  ! Epinephrine 2)  ! Dicyclomine 3)  Sulfa 4)  * Adrenalin (Epinephrine Hcl) 5)  * Oscal - Calcium 6)  Sulfamethoxazole (Sulfamethoxazole) 7)  * Oscal  Anticoagulation Management History:      The patient is taking warfarin and comes in today for a routine follow up visit.  Positive risk factors for bleeding include an age of 45 years or older.  The bleeding index is 'intermediate risk'.  Positive CHADS2 values include Age > 53 years old.  The start date was 06/11/2007.  Anticoagulation responsible provider: Ron Parker MD, Dellis Filbert.  INR POC: 2.3.  Cuvette Lot#: [id number].  Exp: 10/11.    Anticoagulation Management Assessment/Plan:      The patient's current anticoagulation dose is Warfarin sodium 5 mg tabs: Use as directed by Anticoagulation Clinic.  The target INR is 2 - 3.  The next INR is due 12/07/2009.  Anticoagulation instructions were given to patient.  Results were reviewed/authorized by Edrick Oh, RN.  She was notified by Edrick Oh RN.         Prior Anticoagulation Instructions: INR 1.7 Take 1 1/2 tablets tonight then resume 1 tablet once daily except 1/2 tablet on Tuesdays and Fridays  Current Anticoagulation Instructions: INR 2.3 Continue coumadin 5mg  once daily except 2.5mg  on Tuesdays and Fridays Pt is leaving for [place] for 6 months.  She will return in April.  MD in [facility] manages coumadin while she is there.  She will call me when she returns home in April

## 2010-03-08 NOTE — Letter (Signed)
Summary: Appointment -missed  [facility]. [address]   [place], [place] [address]   Phone: [phone]  Fax: [phone]     August 13, 2009 MRN: [id number]     MAKISHA MESKER [place], [place]  [address]     Dear Ms. Ottey,  Our records indicate you missed your appointment on August 13, 2009                        with  COUMADIN CLINIC.   It is very important that we reach you to reschedule this appointment. We look forward to participating in your health care needs.   Please contact us at the number listed above at your earliest convenience to reschedule this appointment.   Sincerely,    Public relations account executive

## 2010-06-17 ENCOUNTER — Encounter: Payer: Self-pay | Admitting: *Deleted

## 2010-06-21 NOTE — Assessment & Plan Note (Signed)
[facility]                          [facility] OFFICE NOTE   NAME:Fuhr, MANMEET DOMINGUE                   MRN:          [id number]  DATE:07/30/2007                            DOB:          [birth year]    REFERRING PHYSICIAN:  Wynelle Cleveland   PRESENT ILLNESS:  The patient is an 77 year old female, wife of Mr.  Maddaloni who is also followed in our clinic.  The patient has a  longstanding history of palpitations.  She has been diagnosed with  paroxysmal atrial fibrillation by CardioNet monitor.  She was seen in  the office last by [facility], and she was asked to increase her atenolol  from 50 mg to 75 mg p.o. daily.  She was also started on  warfarin/Coumadin anticoagulation given her high [place] score.  The  patient has now been doing well.  She reports no substernal chest pain,  shortness of breath, orthopnea, or PND.  She does report occasional  palpitations.  She actually reports an episode last night which was very  brief and also an episode approximately a week ago which lasted several  hours.  She does feel that the increase in atenolol has made her more  fatigued.   MEDICATIONS:  1. Zocor 10 mg p.o. daily.  2. Magnesium 400 mg p.o. b.i.d.  3. One tablespoon Benefiber.  4. Calcium with vitamin D.  5. Multivitamin.  6. Glucosamine.  7. Chondroitin.  8. Biotin.  9. Aspirin.  10.Omega fish oil.  11.Acidophilus tablets.  12.Clobetasol.  13.Estrace cream.  14.Prilosec.  15.Amitriptyline.  16.Atenolol 75 mg p.o. daily.  17.Coumadin according to the Coumadin Clinic schedule.   PHYSICAL EXAMINATION:  VITAL SIGNS:  Blood pressure 129/75, heart rate  65, weight 182 pounds.  NECK:  Normal carotid stroke and no carotid bruits.  LUNGS:  Clear breath sounds bilaterally.  HEART:  Regular rate and rhythm.  Normal S1 and S2, no murmurs, rubs, or  gallops.  ABDOMEN:  Soft and nontender with no rebound or guarding.  Good bowel  sounds.  EXTREMITIES:   No cyanosis, clubbing, or edema.  NEUROLOGIC:  The patient is alert, oriented, and grossly nonfocal.   PROBLEM LIST:  1. Paroxysmal atrial fibrillation, with infrequent paroxysms.  2. Treated dyslipidemia.  3. Normal left ventricular function.  4. History of transient ischemic attack in December 2008.  5. Gastroesophageal reflux disease.  6. History of breast cancer status post right mastectomy.   PLAN:  1. We cut back the patient's atenolol to 50 mg in the morning and 25      in the evening given her complaints of fatigue.  2. I asked the patient to take an extra atenolol 25 mg if she has      palpitations.  3. At this point in time, I do not think any specific antiarrhythmic      therapy is needed as the patient's paroxysms of atrial fibrillation      are quite rare and she is on adequate thrombolic prophylaxis.  4. The patient was given her records today in the office as well as a  referral to Dr. Rudene Anda in [place], [place], where she spends      6 months out of the year.  I actually sent a message to Dr. Mariah Milling      to notify about the patient's referral.     Ernestine Mcmurray, MD,FACC  Electronically Signed    GED/MedQ  DD: 07/30/2007  DT: 07/31/2007  Job #: [id number]   cc:   Wynelle Cleveland

## 2010-06-21 NOTE — Assessment & Plan Note (Signed)
[facility]                          [facility] OFFICE NOTE   NAME:Marissa Garza, Marissa Garza                   MRN:          [id number]  DATE:06/11/2007                            DOB:          1924/03/10    REFERRING PHYSICIAN:  Wynelle Cleveland   REASON FOR REFERRAL:  Palpitations.   HISTORY OF PRESENT ILLNESS:  Marissa Garza is an 77 year old female  patient with a long history of palpitations who had previously been  followed by Dr. Liane Comber.  She was placed on atenolol many years ago and  her palpitations have been quiescent since then.  She is retired and  lives 6 months out of the year in [place], [place].  This past fall,  she started to notice a recurrence of her palpitations.  She seems to  think they are about the same.  She notes that her heart rates are rapid  at times.  She had seen at electrophysiologist in [place] who set her up  with a Holter monitor.  Apparently, the Holter monitor was difficult to  read and she had SVT versus atrial fibrillation.  Her atenolol was  increased.   The patient notes a history of transient ischemic attack in December  2008.  She became aphasic for several minutes.  Her symptoms resolved  and she has not had a recurrence since then.  She saw her primary care  physician, Dr. Rhae Lerner who set her up with an echocardiogram and carotid  dopplers.  Echocardiogram revealed normal LV function with an 55-60% and  no significant valvular abnormalities.  She had no significant ICA  stenosis.  Dr. Rhae Lerner set the patient up with a 30-day CardioNet monitor  and referred her to [place] for further evaluation of her palpitations.   The patient denies any history of chest pain or shortness of breath.  She is quite active.  She describes NYHA class I to II symptoms.  She  denies orthopnea, PND or pedal edema.  She denies any syncope or near-  syncope.   The patient's CardioNet monitor was just placed this past Friday.  She  has  already had one episode of paroxysmal atrial fibrillation with a  heart rate of about 120.  She promptly returned back to sinus rhythm  with a heart rate in the 60s.  She continues to wear her monitor today.   PAST MEDICAL HISTORY:  1. Treated dyslipidemia.  2. History of goiter, questionable radioactive iodine therapy in the      past - the patient cannot remember.  3. Gastroesophageal reflux disease.  4. History of ischemic colitis.  5. History of diverticulosis/diverticulitis.  6. Transient ischemic attack December 2008.  7. 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  8. Status post total abdominal hysterectomy   MEDICATIONS:  1. Zocor 10 mg daily.  2. Atenolol 50 mg daily.  3. Magnesium 400 mg b.i.d.  4. Benefiber daily.  5. Calcium with Vitamin D b.i.d.  6. Multivitamin daily.  7. Glucosamine chondroitin b.i.d.  8. Biotin 1000 mg b.i.d.  9. Aspirin  325 mg daily.  10.Omega-3 fish oil 1000 mg 2 tablets b.i.d.  11.Acidophilus daily.  12.Cortisone cream 1% b.i.d.  13.Clobetasol Cream twice weekly.  14.Estrace cream weekly.  15.Prilosec daily.  16.Amitriptyline 10 mg q.h.s.   ALLERGIES:  SULFA, OS-CAL CALCIUM AND SHE IS HYPERSENSITIVE TO  EPINEPHRINE   SOCIAL HISTORY:  She denies tobacco or alcohol abuse.  She is married.  She is retired.  She had four children, one of whom is deceased from  unknown reasons.  She lives half the year in [place], [place]  and [facility]  is her home.   FAMILY HISTORY:  Significant for CAD.  Her mother died of a heart attack  at age 57.  Father died of stroke at age 38.  She had one brother die at  age 25 of unknown reasons.   REVIEW OF SYSTEMS:  Please see HPI.  Denies any fever, chills, cough,  melena, hematochezia, hematuria, dysuria.  Rest of the review of systems  are negative.   PHYSICAL EXAMINATION:  GENERAL:  She is a well-nourished well-developed  female.  VITAL SIGNS:   Blood pressure is 114/61, pulse 60, weight 178.4 pounds.  HEENT:  Normal.  NECK:  Without JVD.  CARDIAC:  Normal S1, S2.  Regular rate and rhythm without murmur.  LUNGS:  Clear to auscultation bilaterally without wheezing, rhonchi or  rales.  ABDOMEN:  Soft, nontender with normoactive bowel sounds.  No  organomegaly.  EXTREMITIES:  Without edema.  Calves soft, nontender.  SKIN:  Warm, dry.  NEUROLOGIC:  She is alert and oriented x3.  Cranial nerves II-XII  grossly intact.  VASCULAR:  Exam without carotid bruits bilaterally.   DATABASE:  CardioNet event monitor as outlined above.   Carotid ultrasound December 2008 with less than 50% bilateral ICA  stenosis.   IMPRESSION:  1. Paroxysmal atrial fibrillation.  2. Dyslipidemia, treated.  3. History of transient ischemic attack December 2008.  4. GERD.  5. History of breast cancer status post right mastectomy.   PLAN:  1. Ms. Dockett presents to the office today for further evaluation of      palpitations.  She has paroxysmal atrial fibrillation on a      CardioNet event monitor.  Her rate is somewhat accelerated when she      is in atrial fibrillation but luckily her atrial fibrillation is      very brief.  We will increase her atenolol to 75 mg a day for      better rate control.  We have also recommended 25 mg of atenolol      p.r.n. prolonged palpitations.  2. She has a CHADS score of 3.  (age and history of TIAs).  Therefore,      she requires Coumadin.  This has been discussed with the patient      and her husband.  She will start on Coumadin 5 mg a day except for      2-1/2 mg on Saturday and Sunday.  Will check a CBC and an INR today      and follow-up with a PT/INR in our Coumadin clinic on Friday.  She      can d/c her ASA once her INR is >/= 2.  3. She will complete her event monitor.  4. The patient will be brought back in follow-up in the next 6 weeks      to review the final report on her monitor and make further       recommendations.  5. Of note, the patient did have an echocardiogram as outlined above      as well as a TSH drawn in December 2008 that was 1.73.      Richardson Dopp, PA-C  Electronically Signed      Ernestine Mcmurray, MD,FACC  Electronically Signed   SW/MedQ  DD: 06/11/2007  DT: 06/11/2007  Job #: [id number]   cc:   Wynelle Cleveland

## 2010-06-21 NOTE — Assessment & Plan Note (Signed)
[place] OFFICE NOTE   NAME:Tuggle, WIKTORIA HINO                   MRN:          [id number]  DATE:06/11/2007                            DOB:          11-Apr-1924    ADDENDUM   PLAN:  1. Ms. Skora presents to the office today for further evaluation of      palpitations.  She has paroxysmal atrial fibrillation on a      [employer] event monitor.  Her rate is somewhat accelerated when she      is in atrial fibrillation but luckily her atrial fibrillation is      very brief.  We will increase her atenolol to 75 mg a day for      better rate control.  We have also recommended 25 mg of atenolol      p.r.n. prolonged palpitations.  2. She has a CHADS score of 3.  (age and history of TIAs).  Therefore,      she requires Coumadin.  This has been discussed with the patient      and her husband.  She will start on Coumadin 5 mg a day except for      2-1/2 mg on Saturday and Sunday.  Will check a CBC and an INR today      and follow-up with a PT/INR in our Coumadin clinic on Friday.  3. She will complete her event monitor.  4. The patient will be brought back in follow-up in the next 6 weeks      to review the final report on her monitor and make further      recommendations.  5. Of note, the patient did have an echocardiogram as outlined above      as well as a TSH drawn in December 2008 that was 1.73.     Richardson Dopp, PA-C     SW/MedQ  DD: 06/11/2007  DT: 06/11/2007  Job #: [id number]

## 2010-06-24 ENCOUNTER — Ambulatory Visit (INDEPENDENT_AMBULATORY_CARE_PROVIDER_SITE_OTHER): Payer: Medicare Other | Admitting: *Deleted

## 2010-06-24 DIAGNOSIS — Z7901 Long term (current) use of anticoagulants: Secondary | ICD-10-CM

## 2010-06-24 DIAGNOSIS — I4891 Unspecified atrial fibrillation: Secondary | ICD-10-CM

## 2010-06-24 LAB — POCT INR: INR: 2.7

## 2010-07-15 ENCOUNTER — Encounter: Payer: Self-pay | Admitting: Cardiology

## 2010-07-19 ENCOUNTER — Ambulatory Visit: Payer: Self-pay | Admitting: Cardiology

## 2010-07-22 ENCOUNTER — Ambulatory Visit (INDEPENDENT_AMBULATORY_CARE_PROVIDER_SITE_OTHER): Payer: Medicare Other | Admitting: *Deleted

## 2010-07-22 DIAGNOSIS — Z7901 Long term (current) use of anticoagulants: Secondary | ICD-10-CM

## 2010-07-22 DIAGNOSIS — I4891 Unspecified atrial fibrillation: Secondary | ICD-10-CM

## 2010-08-18 ENCOUNTER — Encounter: Payer: PRIVATE HEALTH INSURANCE | Admitting: *Deleted

## 2010-08-18 ENCOUNTER — Ambulatory Visit (INDEPENDENT_AMBULATORY_CARE_PROVIDER_SITE_OTHER): Payer: Medicare Other | Admitting: *Deleted

## 2010-08-18 DIAGNOSIS — Z7901 Long term (current) use of anticoagulants: Secondary | ICD-10-CM

## 2010-08-18 DIAGNOSIS — I4891 Unspecified atrial fibrillation: Secondary | ICD-10-CM

## 2010-08-19 ENCOUNTER — Ambulatory Visit (INDEPENDENT_AMBULATORY_CARE_PROVIDER_SITE_OTHER): Payer: Medicare Other | Admitting: Cardiology

## 2010-08-19 ENCOUNTER — Encounter: Payer: Self-pay | Admitting: Cardiology

## 2010-08-19 ENCOUNTER — Encounter: Payer: PRIVATE HEALTH INSURANCE | Admitting: Cardiology

## 2010-08-19 DIAGNOSIS — Z7901 Long term (current) use of anticoagulants: Secondary | ICD-10-CM

## 2010-08-19 DIAGNOSIS — I4891 Unspecified atrial fibrillation: Secondary | ICD-10-CM

## 2010-08-19 MED ORDER — DILTIAZEM HCL 30 MG PO TABS: 30.0000 mg | ORAL_TABLET | Freq: Every day | ORAL | Status: DC | PRN

## 2010-08-19 MED ORDER — DILTIAZEM HCL ER 120 MG PO CP12: 120.0000 mg | ORAL_CAPSULE | Freq: Every day | ORAL | Status: DC

## 2010-08-19 NOTE — Progress Notes (Signed)
HPI The patient presents for followup of atrial fibrillation. I saw her in the fall and we were managing this with rate control and anticoagulation. It was paroxysmal. Echocardiography was normal. Following that she went back to [place] where she had multiple paroxysms and was started on Multaq.  Since that time she has had no further paroxysms. However, she also stopped drinking alcohol and wonders if this could help maintain sinus rhythm. She is not describing further cardiovascular symptoms. The patient denies any new symptoms such as chest discomfort, neck or arm discomfort. There has been no new shortness of breath, PND or orthopnea. There have been no reported palpitations, presyncope or syncope.  Allergies  Allergen Reactions  . Dicyclomine   . Epinephrine     REACTION: "Hypersensitive"  . Sulfamethoxazole     REACTION: rash  . Sulfonamide Derivatives     REACTION: Rash    Current Outpatient Prescriptions  Medication Sig Dispense Refill  . amitriptyline (ELAVIL) 10 MG tablet Take 10 mg by mouth at bedtime.        Marland Kitchen atenolol (TENORMIN) 50 MG tablet Take 50 mg by mouth 2 (two) times daily.        . Biotin 1000 MCG tablet Take 1,000 mcg by mouth 2 (two) times daily.        . Calcium-Vitamin D-Iron (RA CALCIUM/IRON/VITAMIN D) [id number] MG-UNIT-MG TABS Take 1 tablet by mouth 2 (two) times daily.        . chlorhexidine (PERIDEX) 0.12 % solution as directed.        . clobetasol (TEMOVATE) 0.05 % cream Apply 1 application topically once a week.        . diazepam (VALIUM) 2 MG tablet Take 2 mg by mouth daily as needed.        . dronedarone (MULTAQ) 400 MG tablet Take 400 mg by mouth 2 (two) times daily with a meal.        . hydrocortisone 1 % ointment Apply 1 application topically 2 (two) times daily.        Marland Kitchen LACTASE ENZYME PO Take by mouth as needed.        . Misc Natural Products (GLUCOSAMINE CHONDROITIN ADV PO) Take 1 tablet by mouth 2 (two) times daily.        . Multiple Vitamin  (MULTIVITAMIN) capsule Take 1 capsule by mouth daily.        . NON FORMULARY Tumeric  500mg  2 po daily       . Omega-3 Fatty Acids (FISH OIL) 1000 MG CAPS Take 2 tablets in the morning/ two tablets in the evening       . Probiotic Product (PROBIOTIC FORMULA) CAPS Take by mouth daily.        . simvastatin (ZOCOR) 20 MG tablet Take 10 mg by mouth at bedtime.        Marland Kitchen warfarin (COUMADIN) 5 MG tablet Take by mouth as directed.        . Wheat Dextrin (BENEFIBER) POWD 1 tbsp once a day         Past Medical History  Diagnosis Date  . Hyperlipidemia   . GERD (gastroesophageal reflux disease)     Treated with weight loss  . Atrial fibrillation   . Breast ca     Past Surgical History  Procedure Date  . Partial hysterectomy   . Mastectomy     Right    ROS: As stated in the HPI and negative for all other systems.  PHYSICAL EXAM BP  124/74  Pulse 59  Resp 16  Ht 5' 7.5" (1.715 m)  Wt 169 lb 1.9 oz (76.712 kg)  BMI 26.10 kg/m2 GENERAL:  Well appearing HEENT:  Pupils equal round and reactive, fundi not visualized, oral mucosa unremarkable NECK:  No jugular venous distention, waveform within normal limits, carotid upstroke brisk and symmetric, no bruits, no thyromegaly LYMPHATICS:  No cervical, inguinal adenopathy LUNGS:  Clear to auscultation bilaterally BACK:  No CVA tenderness CHEST:  s/p mastectomy HEART:  PMI not displaced or sustained,S1 and S2 within normal limits, no S3, no S4, no clicks, no rubs, no murmurs ABD:  Flat, positive bowel sounds normal in frequency in pitch, no bruits, no rebound, no guarding, no midline pulsatile mass, no hepatomegaly, no splenomegaly EXT:  2 plus pulses throughout, no edema, no cyanosis no clubbing SKIN:  No rashes no nodules NEURO:  Cranial nerves II through XII grossly intact, motor grossly intact throughout PSYCH:  Cognitively intact, oriented to person place and time   EKG:  Sinus rhythm 59, rate, axis within normal limits, intervals within  normal limits, no acute ST-T wave changes.   ASSESSMENT AND PLAN

## 2010-08-19 NOTE — Assessment & Plan Note (Signed)
We had a long discussion about this today. She would very much like to stop the Multaq mostly for cost reasons.  I agree that he is slightly possible staining from alcohol may help her avoid paroxysms. Understanding that she is likely to have atrial fibrillation again she would like to try this and so she will stop the Multaq.  She will let me know if she has further symptoms.

## 2010-08-19 NOTE — Patient Instructions (Signed)
Please stop your Multaq.  Start Cardizem 120 mg a day, you may take Cardizem 30 mg as needed.  Continue all other medications as needed.  See Dr Percival Spanish in 3 months either here or in [place].

## 2010-08-19 NOTE — Assessment & Plan Note (Signed)
She has this followed in [place].  She knows to call the clinic next week to move up her appt. now that she is stopping Multaq.

## 2010-09-02 ENCOUNTER — Encounter: Payer: PRIVATE HEALTH INSURANCE | Admitting: *Deleted

## 2010-09-06 ENCOUNTER — Ambulatory Visit (INDEPENDENT_AMBULATORY_CARE_PROVIDER_SITE_OTHER): Payer: Medicare Other | Admitting: *Deleted

## 2010-09-06 DIAGNOSIS — I4891 Unspecified atrial fibrillation: Secondary | ICD-10-CM

## 2010-09-06 DIAGNOSIS — Z7901 Long term (current) use of anticoagulants: Secondary | ICD-10-CM

## 2010-09-06 LAB — POCT INR: INR: 2.4

## 2010-09-16 ENCOUNTER — Encounter: Payer: PRIVATE HEALTH INSURANCE | Admitting: *Deleted

## 2010-10-04 ENCOUNTER — Encounter: Payer: Medicare Other | Admitting: *Deleted

## 2010-10-07 ENCOUNTER — Ambulatory Visit (INDEPENDENT_AMBULATORY_CARE_PROVIDER_SITE_OTHER): Payer: Medicare Other | Admitting: *Deleted

## 2010-10-07 DIAGNOSIS — Z7901 Long term (current) use of anticoagulants: Secondary | ICD-10-CM

## 2010-10-07 DIAGNOSIS — I4891 Unspecified atrial fibrillation: Secondary | ICD-10-CM

## 2010-10-07 LAB — POCT INR: INR: 2.2

## 2010-10-08 ENCOUNTER — Other Ambulatory Visit: Payer: Self-pay | Admitting: Cardiology

## 2010-10-11 NOTE — Telephone Encounter (Signed)
[place] pt. 

## 2010-10-18 ENCOUNTER — Other Ambulatory Visit: Payer: Self-pay | Admitting: *Deleted

## 2010-10-18 MED ORDER — SIMVASTATIN 20 MG PO TABS: 10.0000 mg | ORAL_TABLET | Freq: Every day | ORAL | Status: DC

## 2010-11-04 ENCOUNTER — Ambulatory Visit (INDEPENDENT_AMBULATORY_CARE_PROVIDER_SITE_OTHER): Payer: Medicare Other | Admitting: *Deleted

## 2010-11-04 DIAGNOSIS — Z7901 Long term (current) use of anticoagulants: Secondary | ICD-10-CM

## 2010-11-04 DIAGNOSIS — I4891 Unspecified atrial fibrillation: Secondary | ICD-10-CM

## 2010-11-04 LAB — POCT INR: INR: 2.2

## 2010-11-08 ENCOUNTER — Ambulatory Visit: Payer: Medicare Other | Admitting: Cardiology

## 2010-11-22 ENCOUNTER — Encounter: Payer: Medicare Other | Admitting: *Deleted

## 2010-11-22 ENCOUNTER — Encounter: Payer: Self-pay | Admitting: *Deleted

## 2010-12-20 ENCOUNTER — Ambulatory Visit: Payer: Self-pay | Admitting: *Deleted

## 2010-12-20 DIAGNOSIS — I4891 Unspecified atrial fibrillation: Secondary | ICD-10-CM

## 2010-12-20 DIAGNOSIS — Z7901 Long term (current) use of anticoagulants: Secondary | ICD-10-CM

## 2011-07-11 ENCOUNTER — Ambulatory Visit (INDEPENDENT_AMBULATORY_CARE_PROVIDER_SITE_OTHER): Payer: Medicare Other | Admitting: *Deleted

## 2011-07-11 DIAGNOSIS — Z7901 Long term (current) use of anticoagulants: Secondary | ICD-10-CM

## 2011-07-11 DIAGNOSIS — I4891 Unspecified atrial fibrillation: Secondary | ICD-10-CM

## 2011-07-11 DIAGNOSIS — Z8679 Personal history of other diseases of the circulatory system: Secondary | ICD-10-CM

## 2011-07-11 LAB — POCT INR: INR: 2.5

## 2011-07-18 ENCOUNTER — Other Ambulatory Visit: Payer: Self-pay | Admitting: Cardiology

## 2011-07-18 MED ORDER — WARFARIN SODIUM 5 MG PO TABS: 5.0000 mg | ORAL_TABLET | ORAL | Status: DC

## 2011-07-26 ENCOUNTER — Other Ambulatory Visit: Payer: Self-pay | Admitting: *Deleted

## 2011-07-26 MED ORDER — WARFARIN SODIUM 5 MG PO TABS: 5.0000 mg | ORAL_TABLET | ORAL | Status: DC

## 2011-08-23 ENCOUNTER — Encounter (INDEPENDENT_AMBULATORY_CARE_PROVIDER_SITE_OTHER): Payer: Medicare Other | Admitting: Internal Medicine

## 2011-08-23 DIAGNOSIS — C50919 Malignant neoplasm of unspecified site of unspecified female breast: Secondary | ICD-10-CM

## 2011-08-23 DIAGNOSIS — I4891 Unspecified atrial fibrillation: Secondary | ICD-10-CM

## 2011-08-25 ENCOUNTER — Ambulatory Visit (INDEPENDENT_AMBULATORY_CARE_PROVIDER_SITE_OTHER): Payer: Medicare Other | Admitting: *Deleted

## 2011-08-25 DIAGNOSIS — Z8679 Personal history of other diseases of the circulatory system: Secondary | ICD-10-CM

## 2011-08-25 DIAGNOSIS — I4891 Unspecified atrial fibrillation: Secondary | ICD-10-CM

## 2011-08-25 DIAGNOSIS — Z7901 Long term (current) use of anticoagulants: Secondary | ICD-10-CM

## 2011-09-22 ENCOUNTER — Ambulatory Visit (INDEPENDENT_AMBULATORY_CARE_PROVIDER_SITE_OTHER): Payer: Medicare Other | Admitting: *Deleted

## 2011-09-22 DIAGNOSIS — Z8679 Personal history of other diseases of the circulatory system: Secondary | ICD-10-CM

## 2011-09-22 DIAGNOSIS — Z7901 Long term (current) use of anticoagulants: Secondary | ICD-10-CM

## 2011-09-22 DIAGNOSIS — I4891 Unspecified atrial fibrillation: Secondary | ICD-10-CM

## 2011-10-16 ENCOUNTER — Telehealth: Payer: Self-pay | Admitting: Cardiology

## 2011-10-16 NOTE — Telephone Encounter (Signed)
Following [facility]. She will be here 9/13 for ccr

## 2011-10-20 ENCOUNTER — Ambulatory Visit (INDEPENDENT_AMBULATORY_CARE_PROVIDER_SITE_OTHER): Payer: Medicare Other | Admitting: *Deleted

## 2011-10-20 DIAGNOSIS — I4891 Unspecified atrial fibrillation: Secondary | ICD-10-CM

## 2011-10-20 DIAGNOSIS — Z8679 Personal history of other diseases of the circulatory system: Secondary | ICD-10-CM

## 2011-10-20 DIAGNOSIS — Z7901 Long term (current) use of anticoagulants: Secondary | ICD-10-CM

## 2011-10-20 LAB — POCT INR: INR: 2

## 2011-11-17 ENCOUNTER — Ambulatory Visit (INDEPENDENT_AMBULATORY_CARE_PROVIDER_SITE_OTHER): Payer: Medicare Other | Admitting: *Deleted

## 2011-11-17 DIAGNOSIS — Z7901 Long term (current) use of anticoagulants: Secondary | ICD-10-CM

## 2011-11-17 DIAGNOSIS — Z8679 Personal history of other diseases of the circulatory system: Secondary | ICD-10-CM

## 2011-11-17 DIAGNOSIS — I4891 Unspecified atrial fibrillation: Secondary | ICD-10-CM

## 2011-11-17 LAB — POCT INR: INR: 1.9

## 2012-06-25 ENCOUNTER — Ambulatory Visit (INDEPENDENT_AMBULATORY_CARE_PROVIDER_SITE_OTHER): Payer: Medicare Other | Admitting: *Deleted

## 2012-06-25 DIAGNOSIS — Z7901 Long term (current) use of anticoagulants: Secondary | ICD-10-CM

## 2012-06-25 DIAGNOSIS — I4891 Unspecified atrial fibrillation: Secondary | ICD-10-CM

## 2012-06-25 DIAGNOSIS — Z8679 Personal history of other diseases of the circulatory system: Secondary | ICD-10-CM

## 2012-06-25 LAB — POCT INR: INR: 3.2

## 2012-09-02 ENCOUNTER — Ambulatory Visit (INDEPENDENT_AMBULATORY_CARE_PROVIDER_SITE_OTHER): Payer: Medicare Other | Admitting: Cardiovascular Disease

## 2012-09-02 ENCOUNTER — Encounter: Payer: Self-pay | Admitting: Cardiovascular Disease

## 2012-09-02 VITALS — BP 113/72 | HR 63 | Ht 67.5 in | Wt 172.8 lb

## 2012-09-02 DIAGNOSIS — I4891 Unspecified atrial fibrillation: Secondary | ICD-10-CM

## 2012-09-02 DIAGNOSIS — Z7901 Long term (current) use of anticoagulants: Secondary | ICD-10-CM

## 2012-09-02 MED ORDER — DILTIAZEM HCL ER COATED BEADS 180 MG PO CP24: 180.0000 mg | ORAL_CAPSULE | Freq: Every morning | ORAL | Status: AC

## 2012-09-02 NOTE — Patient Instructions (Signed)
Decrease Diltiazem CD 180mg  to once every morning Continue all other current medications. Your physician wants you to follow up in:  1 year.  You will receive a reminder letter in the mail one-two months in advance.  If you don't receive a letter, please call our office to schedule the follow up appointment

## 2012-09-02 NOTE — Progress Notes (Signed)
Patient ID: Marissa Garza, female   DOB: 1924/12/30, 79 y.o.   MRN: [id number]    SUBJECTIVE: Marissa Garza is here for followup of atrial fibrillation. She is being managed with rate control and anticoagulation. It was paroxysmal. Echocardiography was normal in the past. Following that she went back to [place] where she had multiple paroxysms and was started on Multaq. This was eventually stopped due to cost concerns by the patient. However, after having a cup of coffee with caffeine, she went back into atrial fibrillation and had to be electrically cardioverted on January 1st. She is now back on Multaq and doing very well.  Since that time she has had no further paroxysms. However, she also stopped drinking alcohol (wine).  She is not describing further cardiovascular symptoms. The patient denies any new symptoms such as chest discomfort, neck or arm discomfort. There has been no new shortness of breath, PND or orthopnea. There have been no reported palpitations, presyncope or syncope.   She currently takes 180 mg Diltiazem twice daily and Atenolol 25 mg once daily.   Allergies   Allergen  Reactions   .  Dicyclomine    .  Epinephrine      REACTION: "Hypersensitive"   .  Sulfamethoxazole      REACTION: rash   .  Sulfonamide Derivatives      REACTION: Rash     BP: 113/72 HR: 63   PHYSICAL EXAM General: NAD Neck: No JVD, no thyromegaly or thyroid nodule.  Lungs: Clear to auscultation bilaterally with normal respiratory effort. CV: Nondisplaced PMI.  Heart regular S1/S2, no S3/S4, no murmur.  No peripheral edema.  No carotid bruit.  Normal pedal pulses.  Abdomen: Soft, nontender, no hepatosplenomegaly, no distention.  Neurologic: Alert and oriented x 3.  Psych: Normal affect. Extremities: No clubbing or cyanosis.   ECG: NSR, 63 bpm  LABS: Basic Metabolic Panel: No results found for this basename: NA, K, CL, CO2, GLUCOSE, BUN, CREATININE, CALCIUM, MG, PHOS,  in the last 72  hours Liver Function Tests: No results found for this basename: AST, ALT, ALKPHOS, BILITOT, PROT, ALBUMIN,  in the last 72 hours No results found for this basename: LIPASE, AMYLASE,  in the last 72 hours CBC: No results found for this basename: WBC, NEUTROABS, HGB, HCT, MCV, PLT,  in the last 72 hours Cardiac Enzymes: No results found for this basename: CKTOTAL, CKMB, CKMBINDEX, TROPONINI,  in the last 72 hours BNP: No components found with this basename: POCBNP,  D-Dimer: No results found for this basename: DDIMER,  in the last 72 hours Hemoglobin A1C: No results found for this basename: HGBA1C,  in the last 72 hours Fasting Lipid Panel: No results found for this basename: CHOL, HDL, LDLCALC, TRIG, CHOLHDL, LDLDIRECT,  in the last 72 hours Thyroid Function Tests: No results found for this basename: TSH, T4TOTAL, FREET3, T3FREE, THYROIDAB,  in the last 72 hours Anemia Panel: No results found for this basename: VITAMINB12, FOLATE, FERRITIN, TIBC, IRON, RETICCTPCT,  in the last 72 hours     ASSESSMENT AND PLAN: 1. Paroxysmal atrial fibrillation: Will reduce diltiazem to 180 mg once daily, and will continue Warfarin for anticoagulation. Continue Atenolol 25 mg once daily and Multaq.   Kate Sable, M.D., F.A.C.C.

## 2014-02-12 DIAGNOSIS — R197 Diarrhea, unspecified: Secondary | ICD-10-CM | POA: Diagnosis not present

## 2014-02-12 DIAGNOSIS — N189 Chronic kidney disease, unspecified: Secondary | ICD-10-CM | POA: Diagnosis not present

## 2014-02-12 DIAGNOSIS — K5792 Diverticulitis of intestine, part unspecified, without perforation or abscess without bleeding: Secondary | ICD-10-CM | POA: Diagnosis not present

## 2014-02-12 DIAGNOSIS — Z7901 Long term (current) use of anticoagulants: Secondary | ICD-10-CM | POA: Diagnosis not present

## 2014-02-20 DIAGNOSIS — Z7901 Long term (current) use of anticoagulants: Secondary | ICD-10-CM | POA: Diagnosis not present

## 2014-02-21 DIAGNOSIS — R197 Diarrhea, unspecified: Secondary | ICD-10-CM | POA: Diagnosis not present

## 2014-02-21 DIAGNOSIS — Z7901 Long term (current) use of anticoagulants: Secondary | ICD-10-CM | POA: Diagnosis not present

## 2014-02-24 DIAGNOSIS — R197 Diarrhea, unspecified: Secondary | ICD-10-CM | POA: Diagnosis not present

## 2014-02-26 DIAGNOSIS — M7061 Trochanteric bursitis, right hip: Secondary | ICD-10-CM | POA: Diagnosis not present

## 2014-02-26 DIAGNOSIS — I4891 Unspecified atrial fibrillation: Secondary | ICD-10-CM | POA: Diagnosis not present

## 2014-02-26 DIAGNOSIS — K5792 Diverticulitis of intestine, part unspecified, without perforation or abscess without bleeding: Secondary | ICD-10-CM | POA: Diagnosis not present

## 2014-02-26 DIAGNOSIS — L910 Hypertrophic scar: Secondary | ICD-10-CM | POA: Diagnosis not present

## 2014-02-26 DIAGNOSIS — N189 Chronic kidney disease, unspecified: Secondary | ICD-10-CM | POA: Diagnosis not present

## 2014-02-26 DIAGNOSIS — Z85820 Personal history of malignant melanoma of skin: Secondary | ICD-10-CM | POA: Diagnosis not present

## 2014-02-26 DIAGNOSIS — R197 Diarrhea, unspecified: Secondary | ICD-10-CM | POA: Diagnosis not present

## 2014-03-03 DIAGNOSIS — I119 Hypertensive heart disease without heart failure: Secondary | ICD-10-CM | POA: Diagnosis not present

## 2014-03-03 DIAGNOSIS — I482 Chronic atrial fibrillation: Secondary | ICD-10-CM | POA: Diagnosis not present

## 2014-03-19 DIAGNOSIS — K5792 Diverticulitis of intestine, part unspecified, without perforation or abscess without bleeding: Secondary | ICD-10-CM | POA: Diagnosis not present

## 2014-03-19 DIAGNOSIS — J069 Acute upper respiratory infection, unspecified: Secondary | ICD-10-CM | POA: Diagnosis not present

## 2014-03-19 DIAGNOSIS — R197 Diarrhea, unspecified: Secondary | ICD-10-CM | POA: Diagnosis not present

## 2014-03-19 DIAGNOSIS — N189 Chronic kidney disease, unspecified: Secondary | ICD-10-CM | POA: Diagnosis not present

## 2014-03-25 DIAGNOSIS — R197 Diarrhea, unspecified: Secondary | ICD-10-CM | POA: Diagnosis not present

## 2014-03-25 DIAGNOSIS — N189 Chronic kidney disease, unspecified: Secondary | ICD-10-CM | POA: Diagnosis not present

## 2014-03-25 DIAGNOSIS — K5792 Diverticulitis of intestine, part unspecified, without perforation or abscess without bleeding: Secondary | ICD-10-CM | POA: Diagnosis not present

## 2014-03-25 DIAGNOSIS — J069 Acute upper respiratory infection, unspecified: Secondary | ICD-10-CM | POA: Diagnosis not present

## 2014-04-03 DIAGNOSIS — J329 Chronic sinusitis, unspecified: Secondary | ICD-10-CM | POA: Diagnosis not present

## 2014-04-03 DIAGNOSIS — R197 Diarrhea, unspecified: Secondary | ICD-10-CM | POA: Diagnosis not present

## 2014-04-03 DIAGNOSIS — N189 Chronic kidney disease, unspecified: Secondary | ICD-10-CM | POA: Diagnosis not present

## 2014-04-03 DIAGNOSIS — J069 Acute upper respiratory infection, unspecified: Secondary | ICD-10-CM | POA: Diagnosis not present

## 2014-04-17 DIAGNOSIS — R197 Diarrhea, unspecified: Secondary | ICD-10-CM | POA: Diagnosis not present

## 2014-04-17 DIAGNOSIS — I4891 Unspecified atrial fibrillation: Secondary | ICD-10-CM | POA: Diagnosis not present

## 2014-04-17 DIAGNOSIS — J329 Chronic sinusitis, unspecified: Secondary | ICD-10-CM | POA: Diagnosis not present

## 2014-04-17 DIAGNOSIS — K5792 Diverticulitis of intestine, part unspecified, without perforation or abscess without bleeding: Secondary | ICD-10-CM | POA: Diagnosis not present

## 2014-05-01 DIAGNOSIS — Z86010 Personal history of colonic polyps: Secondary | ICD-10-CM | POA: Diagnosis not present

## 2014-05-01 DIAGNOSIS — K5792 Diverticulitis of intestine, part unspecified, without perforation or abscess without bleeding: Secondary | ICD-10-CM | POA: Diagnosis not present

## 2014-05-13 DIAGNOSIS — D124 Benign neoplasm of descending colon: Secondary | ICD-10-CM | POA: Diagnosis not present

## 2014-05-13 DIAGNOSIS — D126 Benign neoplasm of colon, unspecified: Secondary | ICD-10-CM | POA: Diagnosis not present

## 2014-05-13 DIAGNOSIS — D125 Benign neoplasm of sigmoid colon: Secondary | ICD-10-CM | POA: Diagnosis not present

## 2014-05-13 DIAGNOSIS — D122 Benign neoplasm of ascending colon: Secondary | ICD-10-CM | POA: Diagnosis not present

## 2014-05-13 DIAGNOSIS — D123 Benign neoplasm of transverse colon: Secondary | ICD-10-CM | POA: Diagnosis not present

## 2014-06-10 DIAGNOSIS — M1991 Primary osteoarthritis, unspecified site: Secondary | ICD-10-CM | POA: Diagnosis not present

## 2014-06-10 DIAGNOSIS — E782 Mixed hyperlipidemia: Secondary | ICD-10-CM | POA: Diagnosis not present

## 2014-06-10 DIAGNOSIS — I482 Chronic atrial fibrillation: Secondary | ICD-10-CM | POA: Diagnosis not present

## 2014-06-10 DIAGNOSIS — Z853 Personal history of malignant neoplasm of breast: Secondary | ICD-10-CM | POA: Diagnosis not present

## 2014-06-10 DIAGNOSIS — Z1389 Encounter for screening for other disorder: Secondary | ICD-10-CM | POA: Diagnosis not present

## 2014-06-10 DIAGNOSIS — M7061 Trochanteric bursitis, right hip: Secondary | ICD-10-CM | POA: Diagnosis not present

## 2014-09-15 DIAGNOSIS — I482 Chronic atrial fibrillation: Secondary | ICD-10-CM | POA: Diagnosis not present

## 2014-09-15 DIAGNOSIS — G4709 Other insomnia: Secondary | ICD-10-CM | POA: Diagnosis not present

## 2014-09-15 DIAGNOSIS — K591 Functional diarrhea: Secondary | ICD-10-CM | POA: Diagnosis not present

## 2014-09-19 DIAGNOSIS — R197 Diarrhea, unspecified: Secondary | ICD-10-CM | POA: Diagnosis not present

## 2014-10-15 DIAGNOSIS — Z1231 Encounter for screening mammogram for malignant neoplasm of breast: Secondary | ICD-10-CM | POA: Diagnosis not present

## 2014-10-15 DIAGNOSIS — S40862A Insect bite (nonvenomous) of left upper arm, initial encounter: Secondary | ICD-10-CM | POA: Diagnosis not present

## 2014-10-21 DIAGNOSIS — I482 Chronic atrial fibrillation: Secondary | ICD-10-CM | POA: Diagnosis not present

## 2014-10-21 DIAGNOSIS — N183 Chronic kidney disease, stage 3 (moderate): Secondary | ICD-10-CM | POA: Diagnosis not present

## 2014-10-21 DIAGNOSIS — M8580 Other specified disorders of bone density and structure, unspecified site: Secondary | ICD-10-CM | POA: Diagnosis not present

## 2014-10-21 DIAGNOSIS — D0581 Other specified type of carcinoma in situ of right breast: Secondary | ICD-10-CM | POA: Diagnosis not present

## 2014-10-21 DIAGNOSIS — E782 Mixed hyperlipidemia: Secondary | ICD-10-CM | POA: Diagnosis not present

## 2014-11-03 DIAGNOSIS — Z0001 Encounter for general adult medical examination with abnormal findings: Secondary | ICD-10-CM | POA: Diagnosis not present

## 2014-11-03 DIAGNOSIS — M8580 Other specified disorders of bone density and structure, unspecified site: Secondary | ICD-10-CM | POA: Diagnosis not present

## 2014-11-03 DIAGNOSIS — Z23 Encounter for immunization: Secondary | ICD-10-CM | POA: Diagnosis not present

## 2014-11-06 DIAGNOSIS — Z85820 Personal history of malignant melanoma of skin: Secondary | ICD-10-CM | POA: Diagnosis not present

## 2014-11-06 DIAGNOSIS — D239 Other benign neoplasm of skin, unspecified: Secondary | ICD-10-CM | POA: Diagnosis not present

## 2014-11-06 DIAGNOSIS — L570 Actinic keratosis: Secondary | ICD-10-CM | POA: Diagnosis not present

## 2014-11-12 DIAGNOSIS — H524 Presbyopia: Secondary | ICD-10-CM | POA: Diagnosis not present

## 2014-12-18 DIAGNOSIS — I4891 Unspecified atrial fibrillation: Secondary | ICD-10-CM | POA: Diagnosis not present

## 2014-12-18 DIAGNOSIS — Z1211 Encounter for screening for malignant neoplasm of colon: Secondary | ICD-10-CM | POA: Diagnosis not present

## 2014-12-18 DIAGNOSIS — N189 Chronic kidney disease, unspecified: Secondary | ICD-10-CM | POA: Diagnosis not present

## 2014-12-18 DIAGNOSIS — N816 Rectocele: Secondary | ICD-10-CM | POA: Diagnosis not present

## 2014-12-18 DIAGNOSIS — N362 Urethral caruncle: Secondary | ICD-10-CM | POA: Diagnosis not present

## 2015-01-04 DIAGNOSIS — N816 Rectocele: Secondary | ICD-10-CM | POA: Diagnosis not present

## 2015-01-04 DIAGNOSIS — N952 Postmenopausal atrophic vaginitis: Secondary | ICD-10-CM | POA: Diagnosis not present

## 2015-01-04 DIAGNOSIS — N393 Stress incontinence (female) (male): Secondary | ICD-10-CM | POA: Diagnosis not present

## 2015-03-09 DIAGNOSIS — I119 Hypertensive heart disease without heart failure: Secondary | ICD-10-CM | POA: Diagnosis not present

## 2015-03-09 DIAGNOSIS — I482 Chronic atrial fibrillation: Secondary | ICD-10-CM | POA: Diagnosis not present

## 2015-04-14 DIAGNOSIS — Z853 Personal history of malignant neoplasm of breast: Secondary | ICD-10-CM | POA: Diagnosis not present

## 2015-04-14 DIAGNOSIS — G4700 Insomnia, unspecified: Secondary | ICD-10-CM | POA: Diagnosis not present

## 2015-04-14 DIAGNOSIS — Z5181 Encounter for therapeutic drug level monitoring: Secondary | ICD-10-CM | POA: Diagnosis not present

## 2015-04-14 DIAGNOSIS — M8580 Other specified disorders of bone density and structure, unspecified site: Secondary | ICD-10-CM | POA: Diagnosis not present

## 2015-04-14 DIAGNOSIS — Z7901 Long term (current) use of anticoagulants: Secondary | ICD-10-CM | POA: Diagnosis not present

## 2015-04-28 DIAGNOSIS — M6281 Muscle weakness (generalized): Secondary | ICD-10-CM | POA: Diagnosis not present

## 2015-04-28 DIAGNOSIS — M8580 Other specified disorders of bone density and structure, unspecified site: Secondary | ICD-10-CM | POA: Diagnosis not present

## 2015-04-28 DIAGNOSIS — Z7901 Long term (current) use of anticoagulants: Secondary | ICD-10-CM | POA: Diagnosis not present

## 2015-04-28 DIAGNOSIS — N8110 Cystocele, unspecified: Secondary | ICD-10-CM | POA: Diagnosis not present

## 2015-04-28 DIAGNOSIS — G4700 Insomnia, unspecified: Secondary | ICD-10-CM | POA: Diagnosis not present

## 2015-04-28 DIAGNOSIS — I4891 Unspecified atrial fibrillation: Secondary | ICD-10-CM | POA: Diagnosis not present

## 2015-04-28 DIAGNOSIS — R2681 Unsteadiness on feet: Secondary | ICD-10-CM | POA: Diagnosis not present

## 2015-05-05 DIAGNOSIS — M6281 Muscle weakness (generalized): Secondary | ICD-10-CM | POA: Diagnosis not present

## 2015-05-05 DIAGNOSIS — R2681 Unsteadiness on feet: Secondary | ICD-10-CM | POA: Diagnosis not present

## 2015-05-05 DIAGNOSIS — N8110 Cystocele, unspecified: Secondary | ICD-10-CM | POA: Diagnosis not present

## 2015-05-12 DIAGNOSIS — R2681 Unsteadiness on feet: Secondary | ICD-10-CM | POA: Diagnosis not present

## 2015-05-12 DIAGNOSIS — M6281 Muscle weakness (generalized): Secondary | ICD-10-CM | POA: Diagnosis not present

## 2015-05-12 DIAGNOSIS — N8110 Cystocele, unspecified: Secondary | ICD-10-CM | POA: Diagnosis not present

## 2015-05-19 DIAGNOSIS — R2681 Unsteadiness on feet: Secondary | ICD-10-CM | POA: Diagnosis not present

## 2015-05-19 DIAGNOSIS — N8110 Cystocele, unspecified: Secondary | ICD-10-CM | POA: Diagnosis not present

## 2015-05-19 DIAGNOSIS — M6281 Muscle weakness (generalized): Secondary | ICD-10-CM | POA: Diagnosis not present

## 2015-05-21 DIAGNOSIS — R197 Diarrhea, unspecified: Secondary | ICD-10-CM | POA: Diagnosis not present

## 2015-05-21 DIAGNOSIS — M8580 Other specified disorders of bone density and structure, unspecified site: Secondary | ICD-10-CM | POA: Diagnosis not present

## 2015-05-21 DIAGNOSIS — Z853 Personal history of malignant neoplasm of breast: Secondary | ICD-10-CM | POA: Diagnosis not present

## 2015-05-21 DIAGNOSIS — N393 Stress incontinence (female) (male): Secondary | ICD-10-CM | POA: Diagnosis not present

## 2015-05-21 DIAGNOSIS — I4891 Unspecified atrial fibrillation: Secondary | ICD-10-CM | POA: Diagnosis not present

## 2015-05-21 DIAGNOSIS — Z7901 Long term (current) use of anticoagulants: Secondary | ICD-10-CM | POA: Diagnosis not present

## 2015-05-26 DIAGNOSIS — R2681 Unsteadiness on feet: Secondary | ICD-10-CM | POA: Diagnosis not present

## 2015-05-26 DIAGNOSIS — N8110 Cystocele, unspecified: Secondary | ICD-10-CM | POA: Diagnosis not present

## 2015-05-26 DIAGNOSIS — M6281 Muscle weakness (generalized): Secondary | ICD-10-CM | POA: Diagnosis not present

## 2015-06-22 DIAGNOSIS — N816 Rectocele: Secondary | ICD-10-CM | POA: Diagnosis not present

## 2015-07-14 DIAGNOSIS — D2262 Melanocytic nevi of left upper limb, including shoulder: Secondary | ICD-10-CM | POA: Diagnosis not present

## 2015-07-14 DIAGNOSIS — D485 Neoplasm of uncertain behavior of skin: Secondary | ICD-10-CM | POA: Diagnosis not present

## 2015-07-14 DIAGNOSIS — L570 Actinic keratosis: Secondary | ICD-10-CM | POA: Diagnosis not present

## 2015-07-14 DIAGNOSIS — C44712 Basal cell carcinoma of skin of right lower limb, including hip: Secondary | ICD-10-CM | POA: Diagnosis not present

## 2015-07-20 DIAGNOSIS — M791 Myalgia: Secondary | ICD-10-CM | POA: Diagnosis not present

## 2015-07-20 DIAGNOSIS — N816 Rectocele: Secondary | ICD-10-CM | POA: Diagnosis not present

## 2015-07-20 DIAGNOSIS — I482 Chronic atrial fibrillation: Secondary | ICD-10-CM | POA: Diagnosis not present

## 2015-07-20 DIAGNOSIS — M545 Low back pain: Secondary | ICD-10-CM | POA: Diagnosis not present

## 2015-08-02 DIAGNOSIS — I482 Chronic atrial fibrillation: Secondary | ICD-10-CM | POA: Diagnosis not present

## 2015-10-05 DIAGNOSIS — D225 Melanocytic nevi of trunk: Secondary | ICD-10-CM | POA: Diagnosis not present

## 2015-10-05 DIAGNOSIS — L814 Other melanin hyperpigmentation: Secondary | ICD-10-CM | POA: Diagnosis not present

## 2015-10-05 DIAGNOSIS — D485 Neoplasm of uncertain behavior of skin: Secondary | ICD-10-CM | POA: Diagnosis not present

## 2015-10-06 DIAGNOSIS — K5730 Diverticulosis of large intestine without perforation or abscess without bleeding: Secondary | ICD-10-CM | POA: Diagnosis not present

## 2015-10-06 DIAGNOSIS — K591 Functional diarrhea: Secondary | ICD-10-CM | POA: Diagnosis not present

## 2015-10-06 DIAGNOSIS — Z853 Personal history of malignant neoplasm of breast: Secondary | ICD-10-CM | POA: Diagnosis not present

## 2015-10-06 DIAGNOSIS — N816 Rectocele: Secondary | ICD-10-CM | POA: Diagnosis not present

## 2015-10-06 DIAGNOSIS — K5900 Constipation, unspecified: Secondary | ICD-10-CM | POA: Diagnosis not present

## 2015-10-14 DIAGNOSIS — K5732 Diverticulitis of large intestine without perforation or abscess without bleeding: Secondary | ICD-10-CM | POA: Diagnosis not present

## 2015-11-08 DIAGNOSIS — Z23 Encounter for immunization: Secondary | ICD-10-CM | POA: Diagnosis not present

## 2015-11-16 DIAGNOSIS — K5730 Diverticulosis of large intestine without perforation or abscess without bleeding: Secondary | ICD-10-CM | POA: Diagnosis not present

## 2015-11-16 DIAGNOSIS — N183 Chronic kidney disease, stage 3 (moderate): Secondary | ICD-10-CM | POA: Diagnosis not present

## 2015-11-16 DIAGNOSIS — E782 Mixed hyperlipidemia: Secondary | ICD-10-CM | POA: Diagnosis not present

## 2015-11-16 DIAGNOSIS — M1991 Primary osteoarthritis, unspecified site: Secondary | ICD-10-CM | POA: Diagnosis not present

## 2015-11-16 DIAGNOSIS — I482 Chronic atrial fibrillation: Secondary | ICD-10-CM | POA: Diagnosis not present

## 2015-11-19 ENCOUNTER — Ambulatory Visit: Payer: Self-pay | Admitting: Cardiovascular Disease

## 2015-11-22 DIAGNOSIS — Z0001 Encounter for general adult medical examination with abnormal findings: Secondary | ICD-10-CM | POA: Diagnosis not present

## 2015-11-22 DIAGNOSIS — N183 Chronic kidney disease, stage 3 (moderate): Secondary | ICD-10-CM | POA: Diagnosis not present

## 2015-11-25 DIAGNOSIS — H43813 Vitreous degeneration, bilateral: Secondary | ICD-10-CM | POA: Diagnosis not present

## 2015-11-25 DIAGNOSIS — H35372 Puckering of macula, left eye: Secondary | ICD-10-CM | POA: Diagnosis not present

## 2015-12-07 DIAGNOSIS — N390 Urinary tract infection, site not specified: Secondary | ICD-10-CM | POA: Diagnosis not present

## 2016-03-07 DIAGNOSIS — I119 Hypertensive heart disease without heart failure: Secondary | ICD-10-CM | POA: Diagnosis not present

## 2016-03-07 DIAGNOSIS — E782 Mixed hyperlipidemia: Secondary | ICD-10-CM | POA: Diagnosis not present

## 2016-03-07 DIAGNOSIS — I482 Chronic atrial fibrillation: Secondary | ICD-10-CM | POA: Diagnosis not present

## 2016-05-04 DIAGNOSIS — I482 Chronic atrial fibrillation: Secondary | ICD-10-CM | POA: Diagnosis not present

## 2016-05-04 DIAGNOSIS — J209 Acute bronchitis, unspecified: Secondary | ICD-10-CM | POA: Diagnosis not present

## 2016-05-04 DIAGNOSIS — N183 Chronic kidney disease, stage 3 (moderate): Secondary | ICD-10-CM | POA: Diagnosis not present

## 2016-05-23 DIAGNOSIS — M1991 Primary osteoarthritis, unspecified site: Secondary | ICD-10-CM | POA: Diagnosis not present

## 2016-05-23 DIAGNOSIS — I482 Chronic atrial fibrillation: Secondary | ICD-10-CM | POA: Diagnosis not present

## 2016-05-23 DIAGNOSIS — E782 Mixed hyperlipidemia: Secondary | ICD-10-CM | POA: Diagnosis not present

## 2016-05-23 DIAGNOSIS — N183 Chronic kidney disease, stage 3 (moderate): Secondary | ICD-10-CM | POA: Diagnosis not present

## 2016-05-23 DIAGNOSIS — M8580 Other specified disorders of bone density and structure, unspecified site: Secondary | ICD-10-CM | POA: Diagnosis not present

## 2016-05-25 DIAGNOSIS — K5730 Diverticulosis of large intestine without perforation or abscess without bleeding: Secondary | ICD-10-CM | POA: Diagnosis not present

## 2016-05-25 DIAGNOSIS — N183 Chronic kidney disease, stage 3 (moderate): Secondary | ICD-10-CM | POA: Diagnosis not present

## 2016-05-25 DIAGNOSIS — R7301 Impaired fasting glucose: Secondary | ICD-10-CM | POA: Diagnosis not present

## 2016-05-25 DIAGNOSIS — E782 Mixed hyperlipidemia: Secondary | ICD-10-CM | POA: Diagnosis not present

## 2016-05-25 DIAGNOSIS — Z1389 Encounter for screening for other disorder: Secondary | ICD-10-CM | POA: Diagnosis not present

## 2016-05-25 DIAGNOSIS — I482 Chronic atrial fibrillation: Secondary | ICD-10-CM | POA: Diagnosis not present

## 2016-06-27 DIAGNOSIS — R1310 Dysphagia, unspecified: Secondary | ICD-10-CM | POA: Diagnosis not present

## 2016-07-17 DIAGNOSIS — M85852 Other specified disorders of bone density and structure, left thigh: Secondary | ICD-10-CM | POA: Diagnosis not present

## 2016-07-17 DIAGNOSIS — M810 Age-related osteoporosis without current pathological fracture: Secondary | ICD-10-CM | POA: Diagnosis not present

## 2016-08-05 DIAGNOSIS — N183 Chronic kidney disease, stage 3 (moderate): Secondary | ICD-10-CM | POA: Diagnosis not present

## 2016-08-05 DIAGNOSIS — R5383 Other fatigue: Secondary | ICD-10-CM | POA: Diagnosis not present

## 2016-08-05 DIAGNOSIS — I482 Chronic atrial fibrillation: Secondary | ICD-10-CM | POA: Diagnosis not present

## 2016-08-30 DIAGNOSIS — Z85820 Personal history of malignant melanoma of skin: Secondary | ICD-10-CM | POA: Diagnosis not present

## 2016-08-30 DIAGNOSIS — D492 Neoplasm of unspecified behavior of bone, soft tissue, and skin: Secondary | ICD-10-CM | POA: Diagnosis not present

## 2016-08-30 DIAGNOSIS — C44319 Basal cell carcinoma of skin of other parts of face: Secondary | ICD-10-CM | POA: Diagnosis not present

## 2016-08-30 DIAGNOSIS — D229 Melanocytic nevi, unspecified: Secondary | ICD-10-CM | POA: Diagnosis not present

## 2016-08-30 DIAGNOSIS — D2261 Melanocytic nevi of right upper limb, including shoulder: Secondary | ICD-10-CM | POA: Diagnosis not present

## 2016-08-30 DIAGNOSIS — Z85828 Personal history of other malignant neoplasm of skin: Secondary | ICD-10-CM | POA: Diagnosis not present

## 2016-09-07 DIAGNOSIS — I482 Chronic atrial fibrillation: Secondary | ICD-10-CM | POA: Diagnosis not present

## 2016-09-07 DIAGNOSIS — Z87891 Personal history of nicotine dependence: Secondary | ICD-10-CM | POA: Diagnosis not present

## 2016-09-07 DIAGNOSIS — Z8673 Personal history of transient ischemic attack (TIA), and cerebral infarction without residual deficits: Secondary | ICD-10-CM | POA: Diagnosis not present

## 2016-09-07 DIAGNOSIS — I10 Essential (primary) hypertension: Secondary | ICD-10-CM | POA: Diagnosis not present

## 2016-09-07 DIAGNOSIS — Z7901 Long term (current) use of anticoagulants: Secondary | ICD-10-CM | POA: Diagnosis not present

## 2016-09-07 DIAGNOSIS — R0609 Other forms of dyspnea: Secondary | ICD-10-CM | POA: Diagnosis not present

## 2016-09-11 DIAGNOSIS — I482 Chronic atrial fibrillation: Secondary | ICD-10-CM | POA: Diagnosis not present

## 2016-09-11 DIAGNOSIS — E782 Mixed hyperlipidemia: Secondary | ICD-10-CM | POA: Diagnosis not present

## 2016-09-11 DIAGNOSIS — R1310 Dysphagia, unspecified: Secondary | ICD-10-CM | POA: Diagnosis not present

## 2016-09-11 DIAGNOSIS — N183 Chronic kidney disease, stage 3 (moderate): Secondary | ICD-10-CM | POA: Diagnosis not present

## 2016-09-19 DIAGNOSIS — C44319 Basal cell carcinoma of skin of other parts of face: Secondary | ICD-10-CM | POA: Diagnosis not present

## 2016-09-26 DIAGNOSIS — R0609 Other forms of dyspnea: Secondary | ICD-10-CM | POA: Diagnosis not present

## 2016-11-29 DIAGNOSIS — I482 Chronic atrial fibrillation: Secondary | ICD-10-CM | POA: Diagnosis not present

## 2016-11-29 DIAGNOSIS — R7301 Impaired fasting glucose: Secondary | ICD-10-CM | POA: Diagnosis not present

## 2016-11-29 DIAGNOSIS — K5730 Diverticulosis of large intestine without perforation or abscess without bleeding: Secondary | ICD-10-CM | POA: Diagnosis not present

## 2016-11-29 DIAGNOSIS — N183 Chronic kidney disease, stage 3 (moderate): Secondary | ICD-10-CM | POA: Diagnosis not present

## 2016-11-29 DIAGNOSIS — E782 Mixed hyperlipidemia: Secondary | ICD-10-CM | POA: Diagnosis not present

## 2016-11-29 DIAGNOSIS — Z853 Personal history of malignant neoplasm of breast: Secondary | ICD-10-CM | POA: Diagnosis not present

## 2016-11-30 DIAGNOSIS — H43813 Vitreous degeneration, bilateral: Secondary | ICD-10-CM | POA: Diagnosis not present

## 2016-11-30 DIAGNOSIS — H35372 Puckering of macula, left eye: Secondary | ICD-10-CM | POA: Diagnosis not present

## 2016-12-04 DIAGNOSIS — K5730 Diverticulosis of large intestine without perforation or abscess without bleeding: Secondary | ICD-10-CM | POA: Diagnosis not present

## 2016-12-04 DIAGNOSIS — I482 Chronic atrial fibrillation: Secondary | ICD-10-CM | POA: Diagnosis not present

## 2016-12-04 DIAGNOSIS — Z0001 Encounter for general adult medical examination with abnormal findings: Secondary | ICD-10-CM | POA: Diagnosis not present

## 2016-12-04 DIAGNOSIS — E782 Mixed hyperlipidemia: Secondary | ICD-10-CM | POA: Diagnosis not present

## 2016-12-12 ENCOUNTER — Encounter (INDEPENDENT_AMBULATORY_CARE_PROVIDER_SITE_OTHER): Payer: Self-pay | Admitting: Internal Medicine

## 2016-12-13 ENCOUNTER — Encounter (INDEPENDENT_AMBULATORY_CARE_PROVIDER_SITE_OTHER): Payer: Self-pay | Admitting: *Deleted

## 2016-12-13 ENCOUNTER — Encounter (INDEPENDENT_AMBULATORY_CARE_PROVIDER_SITE_OTHER): Payer: Self-pay

## 2016-12-13 ENCOUNTER — Encounter (INDEPENDENT_AMBULATORY_CARE_PROVIDER_SITE_OTHER): Payer: Self-pay | Admitting: Internal Medicine

## 2016-12-13 ENCOUNTER — Ambulatory Visit (INDEPENDENT_AMBULATORY_CARE_PROVIDER_SITE_OTHER): Payer: Medicare Other | Admitting: Internal Medicine

## 2016-12-13 VITALS — BP 113/60 | HR 72 | Temp 97.5°F | Ht 67.0 in | Wt 176.1 lb

## 2016-12-13 DIAGNOSIS — R1319 Other dysphagia: Secondary | ICD-10-CM

## 2016-12-13 DIAGNOSIS — R1310 Dysphagia, unspecified: Secondary | ICD-10-CM | POA: Diagnosis not present

## 2016-12-13 NOTE — Progress Notes (Signed)
Subjective:    Patient ID: Marissa Garza, female    DOB: [birth year], 83 y.o.   MRN: [id number]  HPI Referred by Dr. Pleas Koch for dysphagia.  If she eats to much starchy foods, they will lodge in her esophagus. She will vomit and then drink water. She says this has occurred x 2 in the past 6 months. If she chews her food slow, she does not have a problem. She does not have any trouble eating meats. If she eats something sweat, she sometimes cannot catch her breath. Her appetite is good. No weight loss. Has a BM daily. No melena.   Hx of atrial fib and maintained on Eliquis  11/29/2016 H and H 15.2 and 44.7, albumin 4.1, total bili 0.7, ALP 79, AST 18, ALT 16.  Review of Systems Past Medical History:  Diagnosis Date  . Atrial fibrillation ([place])   . Breast CA ([place])   . GERD (gastroesophageal reflux disease)    Treated with weight loss  . Hyperlipidemia     Past Surgical History:  Procedure Laterality Date  . MASTECTOMY     Right  . PARTIAL HYSTERECTOMY      Allergies  Allergen Reactions  . Dicyclomine   . Epinephrine     REACTION: "Hypersensitive"  . Oscal 500-200 D-3 [Oyster Shell Calcium-D]   . Sulfamethoxazole     REACTION: rash  . Sulfonamide Derivatives     REACTION: Rash    Current Outpatient Medications on File Prior to Visit  Medication Sig Dispense Refill  . apixaban (ELIQUIS) 2.5 MG TABS tablet Take 2.5 mg 2 (two) times daily by mouth.    Marland Kitchen atenolol (TENORMIN) 50 MG tablet Take 25 mg 2 (two) times daily by mouth.     . Biotin 1000 MCG tablet Take 1,000 mcg by mouth 2 (two) times daily.      . Calcium-Vitamin D-Iron (RA CALCIUM/IRON/VITAMIN D) 053-976-73 MG-UNIT-MG TABS Take 1 tablet by mouth 2 (two) times daily.      . clobetasol (TEMOVATE) 0.05 % cream Apply 1 application topically once a week.      . diltiazem (CARDIZEM CD) 180 MG 24 hr capsule Take 1 capsule (180 mg total) by mouth every morning. (Patient taking differently: Take 180 mg 2 (two) times  daily by mouth. ) 90 capsule 3  . glucosamine-chondroitin 500-400 MG tablet Take 1 tablet by mouth 2 (two) times daily.    Marland Kitchen LACTASE ENZYME PO Take by mouth as needed.      . Multiple Vitamin (MULTIVITAMIN) capsule Take 1 capsule by mouth daily.      . Omega-3 Fatty Acids (FISH OIL) 1000 MG CAPS Take 2 tablets in the morning/ two tablets in the evening     . Probiotic Product (PROBIOTIC FORMULA) CAPS Take by mouth daily.      . temazepam (RESTORIL) 30 MG capsule Take 30 mg at bedtime by mouth.    . Wheat Dextrin (BENEFIBER DRINK MIX) PACK Take by mouth.    . Wheat Dextrin (BENEFIBER) POWD 1 tbsp once a day     . NON FORMULARY Tumeric  500mg  2 po daily     . zolpidem (AMBIEN) 5 MG tablet Take 5 mg by mouth at bedtime as needed.      No current facility-administered medications on file prior to visit.         Objective:   Physical Exam Blood pressure 113/60, pulse 72, temperature (!) 97.5 F (36.4 C), height 5\' 7"  (1.702 m), weight  176 lb 1.6 oz (79.9 kg). Alert and oriented. Skin warm and dry. Oral mucosa is moist.   . Sclera anicteric, conjunctivae is pink. Thyroid not enlarged. No cervical lymphadenopathy. Lungs clear. Heart regular rate and rhythm.  Abdomen is soft. Bowel sounds are positive. No hepatomegaly. No abdominal masses felt. No tenderness.  No edema to lower extremities.  .         Assessment & Plan:  Solid food dysphagia. Will get an DG esophagram. Further recommendations to follow.

## 2016-12-13 NOTE — Patient Instructions (Signed)
DG esophagram.   

## 2016-12-18 DIAGNOSIS — Z853 Personal history of malignant neoplasm of breast: Secondary | ICD-10-CM | POA: Diagnosis not present

## 2016-12-18 DIAGNOSIS — H0100A Unspecified blepharitis right eye, upper and lower eyelids: Secondary | ICD-10-CM | POA: Diagnosis not present

## 2016-12-18 DIAGNOSIS — Z9011 Acquired absence of right breast and nipple: Secondary | ICD-10-CM | POA: Diagnosis not present

## 2016-12-20 ENCOUNTER — Ambulatory Visit (HOSPITAL_COMMUNITY)
Admission: RE | Admit: 2016-12-20 | Discharge: 2016-12-20 | Disposition: A | Discharge status: Home / Self Care | Payer: Medicare Other | Source: Ambulatory Visit | Attending: Internal Medicine | Admitting: Internal Medicine

## 2016-12-20 ENCOUNTER — Encounter (HOSPITAL_COMMUNITY): Payer: Self-pay

## 2016-12-20 DIAGNOSIS — R1319 Other dysphagia: Secondary | ICD-10-CM

## 2016-12-20 DIAGNOSIS — R1310 Dysphagia, unspecified: Secondary | ICD-10-CM | POA: Insufficient documentation

## 2016-12-20 DIAGNOSIS — K224 Dyskinesia of esophagus: Secondary | ICD-10-CM | POA: Insufficient documentation

## 2017-01-03 ENCOUNTER — Encounter (INDEPENDENT_AMBULATORY_CARE_PROVIDER_SITE_OTHER): Payer: Self-pay | Admitting: Internal Medicine

## 2017-01-03 NOTE — Progress Notes (Signed)
Patient was given an appointment for 04/05/17 at 10:45am with Deberah Castle, NP.  A letter was mailed to the patient.

## 2017-01-23 ENCOUNTER — Telehealth (INDEPENDENT_AMBULATORY_CARE_PROVIDER_SITE_OTHER): Payer: Self-pay | Admitting: Internal Medicine

## 2017-01-23 NOTE — Telephone Encounter (Signed)
I have spoken with patient. Results were given to her on 12/21/2016.

## 2017-01-23 NOTE — Telephone Encounter (Signed)
Patient called this morning to cancel her February appointment, stated she was going to see how things did.  I've cancelled that for her, but she stated she never got her results.  Please call the patient.  [phone]

## 2017-01-25 DIAGNOSIS — H10413 Chronic giant papillary conjunctivitis, bilateral: Secondary | ICD-10-CM | POA: Diagnosis not present

## 2017-02-05 DIAGNOSIS — N183 Chronic kidney disease, stage 3 (moderate): Secondary | ICD-10-CM | POA: Diagnosis not present

## 2017-02-05 DIAGNOSIS — J209 Acute bronchitis, unspecified: Secondary | ICD-10-CM | POA: Diagnosis not present

## 2017-02-05 DIAGNOSIS — I482 Chronic atrial fibrillation: Secondary | ICD-10-CM | POA: Diagnosis not present

## 2017-02-27 DIAGNOSIS — L814 Other melanin hyperpigmentation: Secondary | ICD-10-CM | POA: Diagnosis not present

## 2017-02-27 DIAGNOSIS — D485 Neoplasm of uncertain behavior of skin: Secondary | ICD-10-CM | POA: Diagnosis not present

## 2017-02-27 DIAGNOSIS — D225 Melanocytic nevi of trunk: Secondary | ICD-10-CM | POA: Diagnosis not present

## 2017-03-09 DIAGNOSIS — K5732 Diverticulitis of large intestine without perforation or abscess without bleeding: Secondary | ICD-10-CM | POA: Diagnosis not present

## 2017-04-05 ENCOUNTER — Ambulatory Visit (INDEPENDENT_AMBULATORY_CARE_PROVIDER_SITE_OTHER): Payer: Medicare Other | Admitting: Internal Medicine

## 2017-04-05 DIAGNOSIS — Z7901 Long term (current) use of anticoagulants: Secondary | ICD-10-CM | POA: Diagnosis not present

## 2017-04-05 DIAGNOSIS — Z8673 Personal history of transient ischemic attack (TIA), and cerebral infarction without residual deficits: Secondary | ICD-10-CM | POA: Diagnosis not present

## 2017-04-05 DIAGNOSIS — I482 Chronic atrial fibrillation: Secondary | ICD-10-CM | POA: Diagnosis not present

## 2017-04-05 DIAGNOSIS — R9431 Abnormal electrocardiogram [ECG] [EKG]: Secondary | ICD-10-CM | POA: Diagnosis not present

## 2017-04-05 DIAGNOSIS — I4891 Unspecified atrial fibrillation: Secondary | ICD-10-CM | POA: Diagnosis not present

## 2017-05-03 DIAGNOSIS — Z85820 Personal history of malignant melanoma of skin: Secondary | ICD-10-CM | POA: Diagnosis not present

## 2017-05-03 DIAGNOSIS — D229 Melanocytic nevi, unspecified: Secondary | ICD-10-CM | POA: Diagnosis not present

## 2017-05-03 DIAGNOSIS — C44319 Basal cell carcinoma of skin of other parts of face: Secondary | ICD-10-CM | POA: Diagnosis not present

## 2017-05-31 DIAGNOSIS — C44319 Basal cell carcinoma of skin of other parts of face: Secondary | ICD-10-CM | POA: Diagnosis not present

## 2017-06-04 DIAGNOSIS — R7301 Impaired fasting glucose: Secondary | ICD-10-CM | POA: Diagnosis not present

## 2017-06-04 DIAGNOSIS — R5383 Other fatigue: Secondary | ICD-10-CM | POA: Diagnosis not present

## 2017-06-04 DIAGNOSIS — Z853 Personal history of malignant neoplasm of breast: Secondary | ICD-10-CM | POA: Diagnosis not present

## 2017-06-04 DIAGNOSIS — D519 Vitamin B12 deficiency anemia, unspecified: Secondary | ICD-10-CM | POA: Diagnosis not present

## 2017-06-04 DIAGNOSIS — E782 Mixed hyperlipidemia: Secondary | ICD-10-CM | POA: Diagnosis not present

## 2017-06-04 DIAGNOSIS — K5730 Diverticulosis of large intestine without perforation or abscess without bleeding: Secondary | ICD-10-CM | POA: Diagnosis not present

## 2017-06-07 DIAGNOSIS — I482 Chronic atrial fibrillation: Secondary | ICD-10-CM | POA: Diagnosis not present

## 2017-06-07 DIAGNOSIS — E782 Mixed hyperlipidemia: Secondary | ICD-10-CM | POA: Diagnosis not present

## 2017-06-07 DIAGNOSIS — Z1331 Encounter for screening for depression: Secondary | ICD-10-CM | POA: Diagnosis not present

## 2017-06-07 DIAGNOSIS — M545 Low back pain: Secondary | ICD-10-CM | POA: Diagnosis not present

## 2017-06-07 DIAGNOSIS — K5730 Diverticulosis of large intestine without perforation or abscess without bleeding: Secondary | ICD-10-CM | POA: Diagnosis not present

## 2017-06-07 DIAGNOSIS — Z1389 Encounter for screening for other disorder: Secondary | ICD-10-CM | POA: Diagnosis not present

## 2017-06-07 DIAGNOSIS — G4709 Other insomnia: Secondary | ICD-10-CM | POA: Diagnosis not present

## 2017-07-17 DIAGNOSIS — H43813 Vitreous degeneration, bilateral: Secondary | ICD-10-CM | POA: Diagnosis not present

## 2017-07-17 DIAGNOSIS — H10413 Chronic giant papillary conjunctivitis, bilateral: Secondary | ICD-10-CM | POA: Diagnosis not present

## 2017-07-17 DIAGNOSIS — H35372 Puckering of macula, left eye: Secondary | ICD-10-CM | POA: Diagnosis not present

## 2017-09-06 DIAGNOSIS — R7301 Impaired fasting glucose: Secondary | ICD-10-CM | POA: Diagnosis not present

## 2017-09-06 DIAGNOSIS — I482 Chronic atrial fibrillation: Secondary | ICD-10-CM | POA: Diagnosis not present

## 2017-09-06 DIAGNOSIS — M1991 Primary osteoarthritis, unspecified site: Secondary | ICD-10-CM | POA: Diagnosis not present

## 2017-09-06 DIAGNOSIS — N183 Chronic kidney disease, stage 3 (moderate): Secondary | ICD-10-CM | POA: Diagnosis not present

## 2017-09-06 DIAGNOSIS — M8580 Other specified disorders of bone density and structure, unspecified site: Secondary | ICD-10-CM | POA: Diagnosis not present

## 2017-09-10 DIAGNOSIS — R7301 Impaired fasting glucose: Secondary | ICD-10-CM | POA: Diagnosis not present

## 2017-09-10 DIAGNOSIS — I482 Chronic atrial fibrillation: Secondary | ICD-10-CM | POA: Diagnosis not present

## 2017-09-10 DIAGNOSIS — R1310 Dysphagia, unspecified: Secondary | ICD-10-CM | POA: Diagnosis not present

## 2017-09-10 DIAGNOSIS — E782 Mixed hyperlipidemia: Secondary | ICD-10-CM | POA: Diagnosis not present

## 2017-09-10 DIAGNOSIS — Z853 Personal history of malignant neoplasm of breast: Secondary | ICD-10-CM | POA: Diagnosis not present

## 2017-09-21 DIAGNOSIS — D485 Neoplasm of uncertain behavior of skin: Secondary | ICD-10-CM | POA: Diagnosis not present

## 2017-09-21 DIAGNOSIS — D2239 Melanocytic nevi of other parts of face: Secondary | ICD-10-CM | POA: Diagnosis not present

## 2017-09-21 DIAGNOSIS — L570 Actinic keratosis: Secondary | ICD-10-CM | POA: Diagnosis not present

## 2017-09-27 DIAGNOSIS — R0600 Dyspnea, unspecified: Secondary | ICD-10-CM | POA: Diagnosis not present

## 2017-09-27 DIAGNOSIS — R5383 Other fatigue: Secondary | ICD-10-CM | POA: Diagnosis not present

## 2017-09-27 DIAGNOSIS — I4891 Unspecified atrial fibrillation: Secondary | ICD-10-CM | POA: Diagnosis not present

## 2017-09-27 DIAGNOSIS — R0602 Shortness of breath: Secondary | ICD-10-CM | POA: Diagnosis not present

## 2017-09-27 DIAGNOSIS — R6889 Other general symptoms and signs: Secondary | ICD-10-CM | POA: Diagnosis not present

## 2017-11-22 DIAGNOSIS — Z23 Encounter for immunization: Secondary | ICD-10-CM | POA: Diagnosis not present

## 2017-12-06 DIAGNOSIS — H10413 Chronic giant papillary conjunctivitis, bilateral: Secondary | ICD-10-CM | POA: Diagnosis not present

## 2017-12-06 DIAGNOSIS — H43813 Vitreous degeneration, bilateral: Secondary | ICD-10-CM | POA: Diagnosis not present

## 2017-12-06 DIAGNOSIS — H35372 Puckering of macula, left eye: Secondary | ICD-10-CM | POA: Diagnosis not present

## 2017-12-10 DIAGNOSIS — R7301 Impaired fasting glucose: Secondary | ICD-10-CM | POA: Diagnosis not present

## 2017-12-10 DIAGNOSIS — E782 Mixed hyperlipidemia: Secondary | ICD-10-CM | POA: Diagnosis not present

## 2017-12-10 DIAGNOSIS — R5383 Other fatigue: Secondary | ICD-10-CM | POA: Diagnosis not present

## 2017-12-10 DIAGNOSIS — N183 Chronic kidney disease, stage 3 (moderate): Secondary | ICD-10-CM | POA: Diagnosis not present

## 2017-12-10 DIAGNOSIS — E559 Vitamin D deficiency, unspecified: Secondary | ICD-10-CM | POA: Diagnosis not present

## 2017-12-14 DIAGNOSIS — R1310 Dysphagia, unspecified: Secondary | ICD-10-CM | POA: Diagnosis not present

## 2017-12-14 DIAGNOSIS — K5730 Diverticulosis of large intestine without perforation or abscess without bleeding: Secondary | ICD-10-CM | POA: Diagnosis not present

## 2017-12-14 DIAGNOSIS — R7301 Impaired fasting glucose: Secondary | ICD-10-CM | POA: Diagnosis not present

## 2017-12-14 DIAGNOSIS — I482 Chronic atrial fibrillation, unspecified: Secondary | ICD-10-CM | POA: Diagnosis not present

## 2017-12-14 DIAGNOSIS — E782 Mixed hyperlipidemia: Secondary | ICD-10-CM | POA: Diagnosis not present

## 2018-02-02 IMAGING — RF DG ESOPHAGUS
7 series · 14 of 24 positions shown · non-contrast
Comparison: None.

CLINICAL DATA: Dysphagia for 6 months.

EXAM:
ESOPHOGRAM / BARIUM SWALLOW / BARIUM TABLET STUDY
TECHNIQUE: Combined double contrast and single contrast examination performed
using effervescent crystals, thick barium liquid, and thin barium
liquid. The patient was observed with fluoroscopy swallowing a 13 mm
barium sulphate tablet.
FLUOROSCOPY TIME:  Fluoroscopy Time:  1 minutes 24 seconds

[Series 1: cp_standard · 0.26mm/px · 2 of 113 frames shown (1 of 7)]
[frame 17/113]
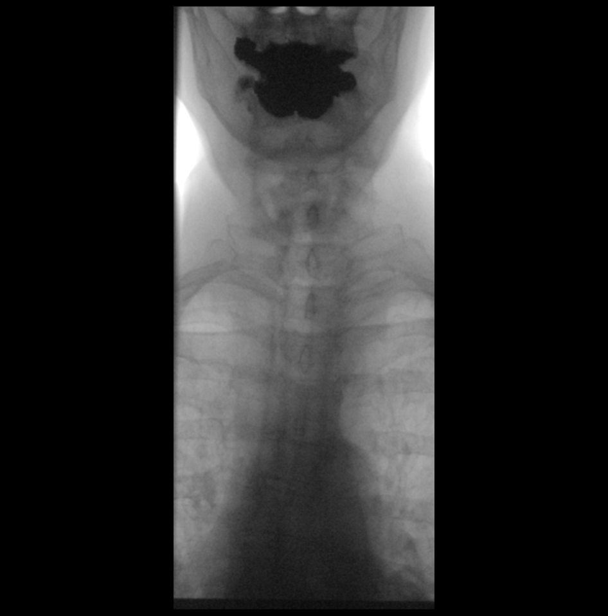
[frame 57/113]
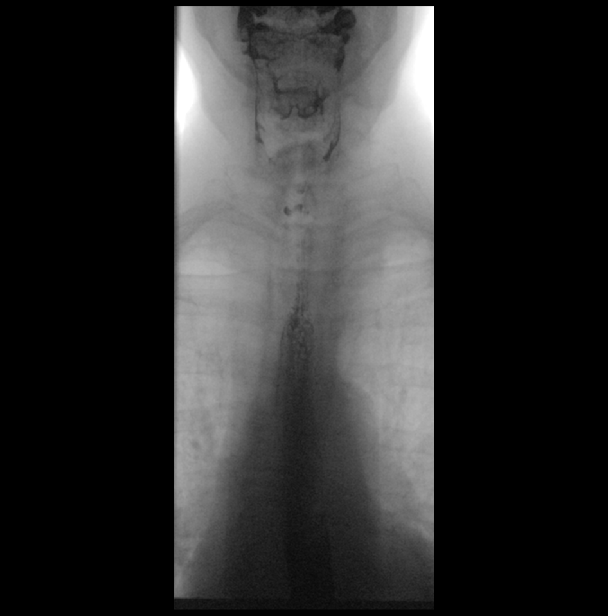

[Series 2: cp_standard · 0.27mm/px · 2 of 118 frames shown (2 of 7)]
[frame 18/118]
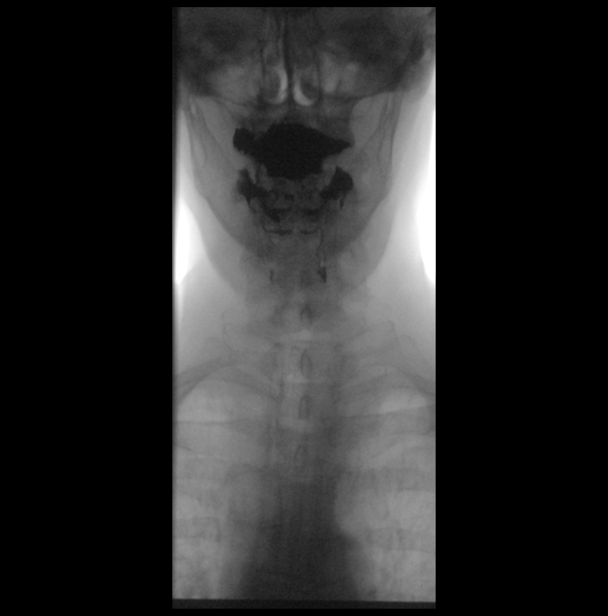
[frame 101/118]
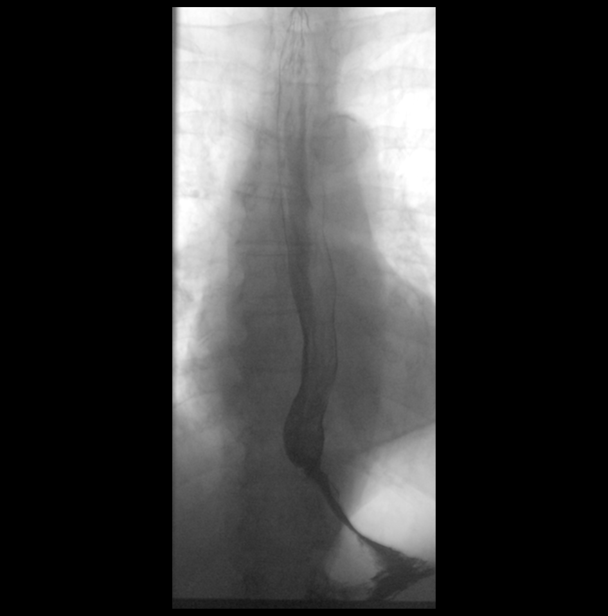

[Series 3: cp_standard · 0.26mm/px · 2 of 113 frames shown (3 of 7)]
[frame 17/113]
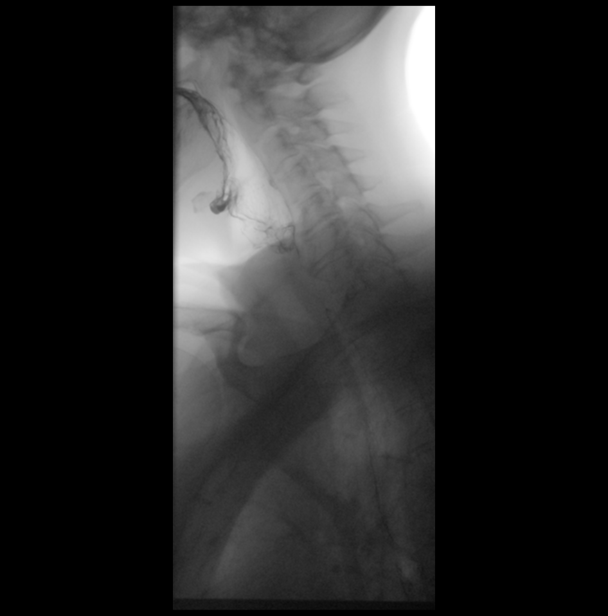
[frame 97/113]
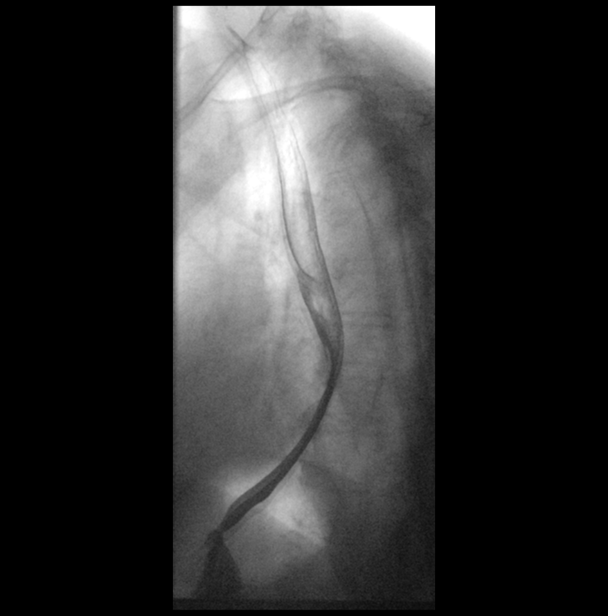

[Series 4: cp_standard · 0.26mm/px · 2 of 42 frames shown (4 of 7)]
[frame 22/42]
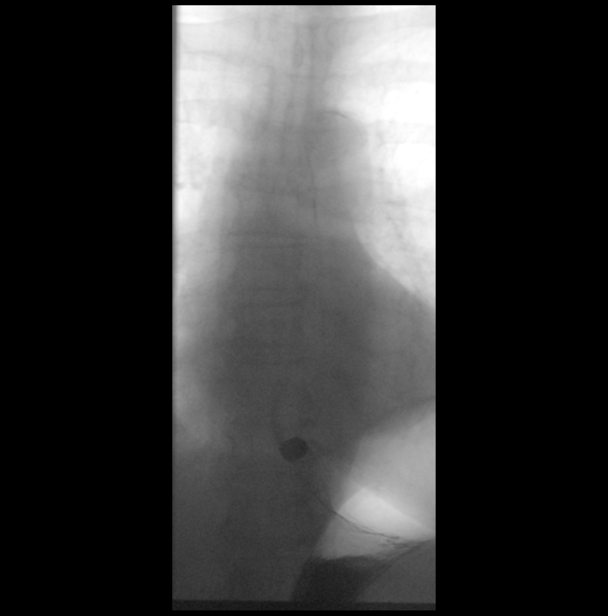
[frame 36/42]
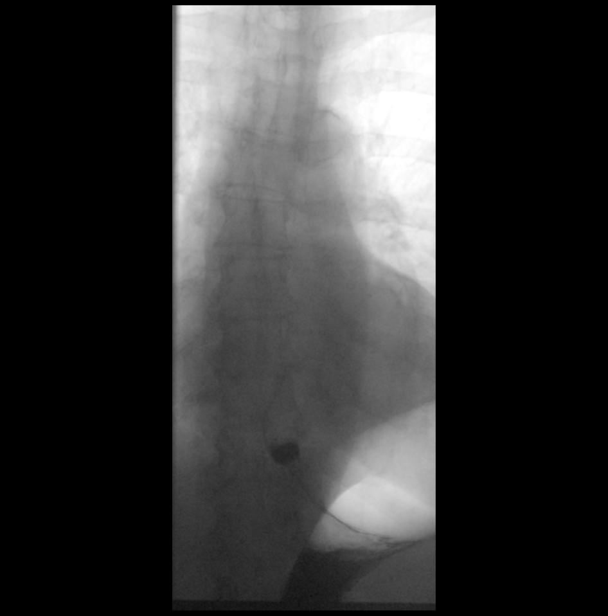

[Series 5: cp_standard · 0.26mm/px · 2 of 80 frames shown (5 of 7)]
[frame 41/80]
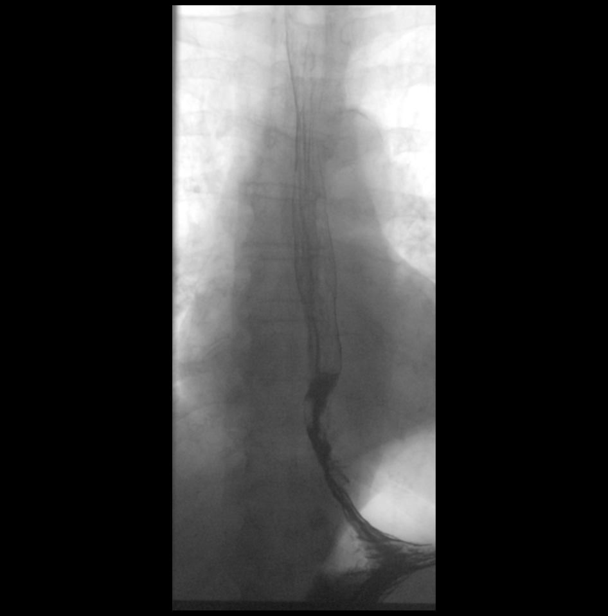
[frame 69/80]
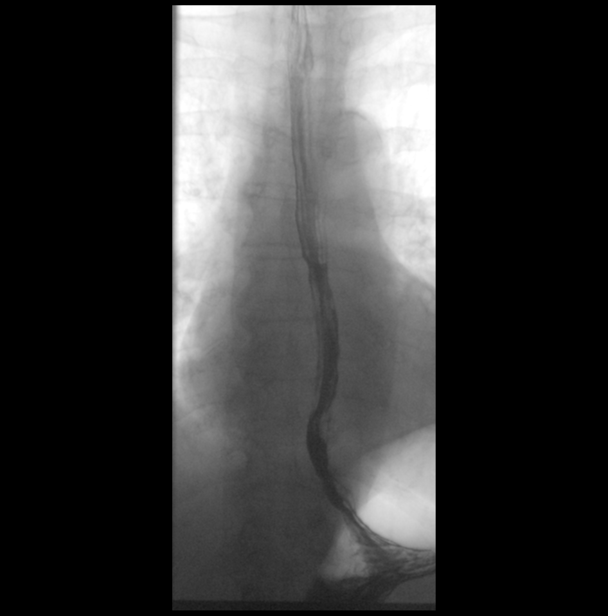

[Series 6: cp_standard · 0.29mm/px · 2 of 105 frames shown (6 of 7)]
[frame 90/105]
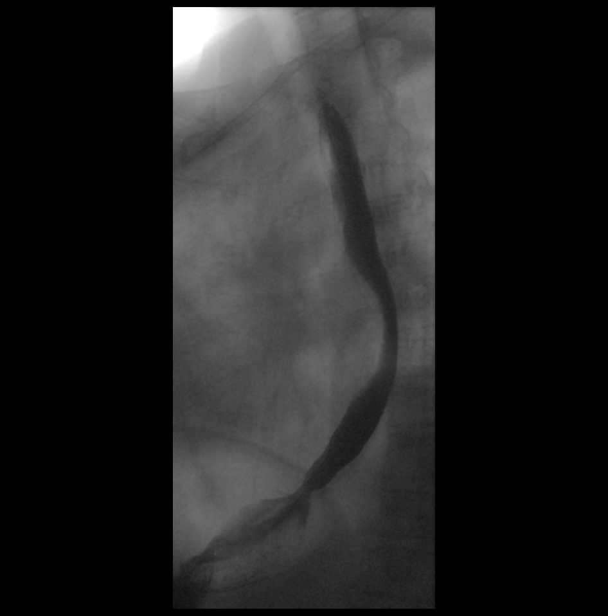
[frame 101/105]
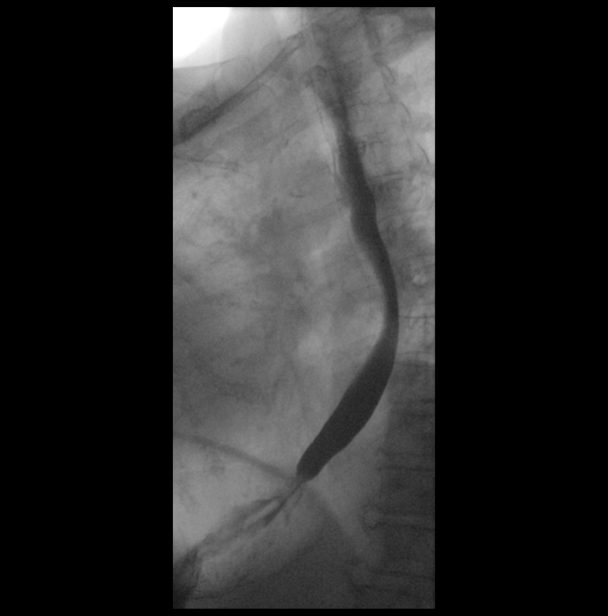

[Series 7: cp_standard · 0.28mm/px · 2 of 141 frames shown (7 of 7)]
[frame 71/141]
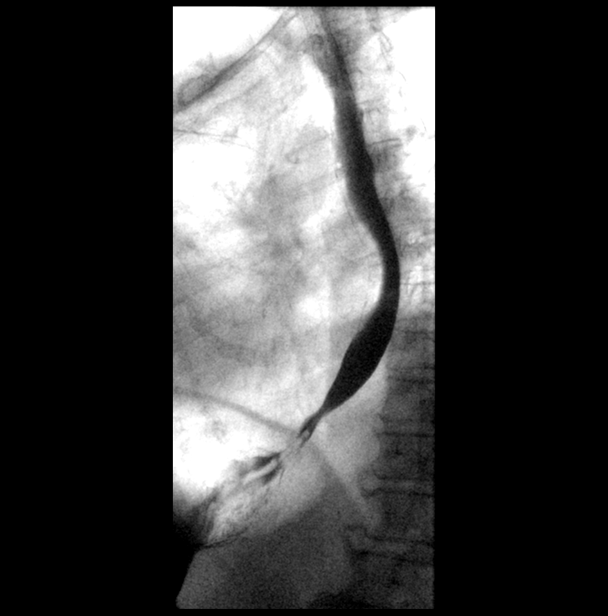
[frame 136/141]
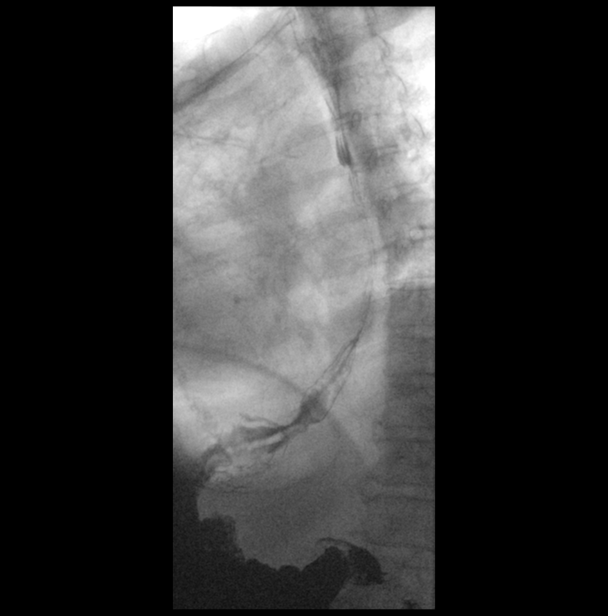

[14 of 24 positions shown; findings below may reference images not displayed]

FINDINGS: The oropharyngeal swallowing mechanisms are normal. Valleculae and
piriform sinuses are normal.

The mucosa and motility of the esophagus are normal. No hiatal
hernia.

The patient ingested a 13 mm barium tablet which lodged at the
gastroesophageal junction for approximately 45 seconds but then
passed into the stomach without dissolution after an additional
swallow of thick barium.
IMPRESSION: Temporary spasm at the gastroesophageal junction slightly delayed
passage of the tablet. However, there is no stricture.

Otherwise, normal barium esophagram.

## 2018-03-08 DIAGNOSIS — M9903 Segmental and somatic dysfunction of lumbar region: Secondary | ICD-10-CM | POA: Diagnosis not present

## 2018-03-08 DIAGNOSIS — M47816 Spondylosis without myelopathy or radiculopathy, lumbar region: Secondary | ICD-10-CM | POA: Diagnosis not present

## 2018-03-08 DIAGNOSIS — M545 Low back pain: Secondary | ICD-10-CM | POA: Diagnosis not present

## 2018-03-12 DIAGNOSIS — M9903 Segmental and somatic dysfunction of lumbar region: Secondary | ICD-10-CM | POA: Diagnosis not present

## 2018-03-12 DIAGNOSIS — M47816 Spondylosis without myelopathy or radiculopathy, lumbar region: Secondary | ICD-10-CM | POA: Diagnosis not present

## 2018-03-12 DIAGNOSIS — M545 Low back pain: Secondary | ICD-10-CM | POA: Diagnosis not present

## 2018-03-15 DIAGNOSIS — M9903 Segmental and somatic dysfunction of lumbar region: Secondary | ICD-10-CM | POA: Diagnosis not present

## 2018-03-15 DIAGNOSIS — M545 Low back pain: Secondary | ICD-10-CM | POA: Diagnosis not present

## 2018-03-15 DIAGNOSIS — M47816 Spondylosis without myelopathy or radiculopathy, lumbar region: Secondary | ICD-10-CM | POA: Diagnosis not present

## 2018-03-22 DIAGNOSIS — M545 Low back pain: Secondary | ICD-10-CM | POA: Diagnosis not present

## 2018-03-22 DIAGNOSIS — M9903 Segmental and somatic dysfunction of lumbar region: Secondary | ICD-10-CM | POA: Diagnosis not present

## 2018-03-22 DIAGNOSIS — M47816 Spondylosis without myelopathy or radiculopathy, lumbar region: Secondary | ICD-10-CM | POA: Diagnosis not present

## 2018-03-29 DIAGNOSIS — M9903 Segmental and somatic dysfunction of lumbar region: Secondary | ICD-10-CM | POA: Diagnosis not present

## 2018-03-29 DIAGNOSIS — M545 Low back pain: Secondary | ICD-10-CM | POA: Diagnosis not present

## 2018-03-29 DIAGNOSIS — M47816 Spondylosis without myelopathy or radiculopathy, lumbar region: Secondary | ICD-10-CM | POA: Diagnosis not present

## 2018-04-04 DIAGNOSIS — Z7901 Long term (current) use of anticoagulants: Secondary | ICD-10-CM | POA: Diagnosis not present

## 2018-04-04 DIAGNOSIS — I10 Essential (primary) hypertension: Secondary | ICD-10-CM | POA: Diagnosis not present

## 2018-04-04 DIAGNOSIS — I4821 Permanent atrial fibrillation: Secondary | ICD-10-CM | POA: Diagnosis not present

## 2018-04-04 DIAGNOSIS — I4891 Unspecified atrial fibrillation: Secondary | ICD-10-CM | POA: Diagnosis not present

## 2018-04-04 DIAGNOSIS — R0609 Other forms of dyspnea: Secondary | ICD-10-CM | POA: Diagnosis not present

## 2018-04-04 DIAGNOSIS — I482 Chronic atrial fibrillation, unspecified: Secondary | ICD-10-CM | POA: Diagnosis not present

## 2018-04-04 DIAGNOSIS — Z8673 Personal history of transient ischemic attack (TIA), and cerebral infarction without residual deficits: Secondary | ICD-10-CM | POA: Diagnosis not present

## 2018-04-05 DIAGNOSIS — M545 Low back pain: Secondary | ICD-10-CM | POA: Diagnosis not present

## 2018-04-05 DIAGNOSIS — M47816 Spondylosis without myelopathy or radiculopathy, lumbar region: Secondary | ICD-10-CM | POA: Diagnosis not present

## 2018-04-05 DIAGNOSIS — M9903 Segmental and somatic dysfunction of lumbar region: Secondary | ICD-10-CM | POA: Diagnosis not present

## 2018-04-12 DIAGNOSIS — M545 Low back pain: Secondary | ICD-10-CM | POA: Diagnosis not present

## 2018-04-12 DIAGNOSIS — M9903 Segmental and somatic dysfunction of lumbar region: Secondary | ICD-10-CM | POA: Diagnosis not present

## 2018-04-12 DIAGNOSIS — M47816 Spondylosis without myelopathy or radiculopathy, lumbar region: Secondary | ICD-10-CM | POA: Diagnosis not present

## 2018-04-26 DIAGNOSIS — R300 Dysuria: Secondary | ICD-10-CM | POA: Diagnosis not present

## 2018-05-01 DIAGNOSIS — R350 Frequency of micturition: Secondary | ICD-10-CM | POA: Diagnosis not present

## 2018-05-06 DIAGNOSIS — N816 Rectocele: Secondary | ICD-10-CM | POA: Diagnosis not present

## 2018-05-06 DIAGNOSIS — R300 Dysuria: Secondary | ICD-10-CM | POA: Diagnosis not present

## 2018-07-02 DIAGNOSIS — K5732 Diverticulitis of large intestine without perforation or abscess without bleeding: Secondary | ICD-10-CM | POA: Diagnosis not present

## 2018-07-02 DIAGNOSIS — N183 Chronic kidney disease, stage 3 (moderate): Secondary | ICD-10-CM | POA: Diagnosis not present

## 2018-08-14 DIAGNOSIS — Z9011 Acquired absence of right breast and nipple: Secondary | ICD-10-CM | POA: Diagnosis not present

## 2018-08-26 DIAGNOSIS — E559 Vitamin D deficiency, unspecified: Secondary | ICD-10-CM | POA: Diagnosis not present

## 2018-08-26 DIAGNOSIS — N183 Chronic kidney disease, stage 3 (moderate): Secondary | ICD-10-CM | POA: Diagnosis not present

## 2018-08-26 DIAGNOSIS — R7301 Impaired fasting glucose: Secondary | ICD-10-CM | POA: Diagnosis not present

## 2018-08-26 DIAGNOSIS — E782 Mixed hyperlipidemia: Secondary | ICD-10-CM | POA: Diagnosis not present

## 2018-08-26 DIAGNOSIS — R5383 Other fatigue: Secondary | ICD-10-CM | POA: Diagnosis not present

## 2018-08-29 DIAGNOSIS — E782 Mixed hyperlipidemia: Secondary | ICD-10-CM | POA: Diagnosis not present

## 2018-08-29 DIAGNOSIS — K5730 Diverticulosis of large intestine without perforation or abscess without bleeding: Secondary | ICD-10-CM | POA: Diagnosis not present

## 2018-08-29 DIAGNOSIS — N816 Rectocele: Secondary | ICD-10-CM | POA: Diagnosis not present

## 2018-08-29 DIAGNOSIS — I482 Chronic atrial fibrillation, unspecified: Secondary | ICD-10-CM | POA: Diagnosis not present

## 2018-08-29 DIAGNOSIS — N183 Chronic kidney disease, stage 3 (moderate): Secondary | ICD-10-CM | POA: Diagnosis not present

## 2018-09-03 DIAGNOSIS — Z9011 Acquired absence of right breast and nipple: Secondary | ICD-10-CM | POA: Diagnosis not present

## 2018-09-12 DIAGNOSIS — Z23 Encounter for immunization: Secondary | ICD-10-CM | POA: Diagnosis not present

## 2018-10-15 DIAGNOSIS — D229 Melanocytic nevi, unspecified: Secondary | ICD-10-CM | POA: Diagnosis not present

## 2018-10-15 DIAGNOSIS — D225 Melanocytic nevi of trunk: Secondary | ICD-10-CM | POA: Diagnosis not present

## 2018-10-15 DIAGNOSIS — D485 Neoplasm of uncertain behavior of skin: Secondary | ICD-10-CM | POA: Diagnosis not present

## 2018-10-15 DIAGNOSIS — Z85820 Personal history of malignant melanoma of skin: Secondary | ICD-10-CM | POA: Diagnosis not present

## 2018-11-14 DIAGNOSIS — N762 Acute vulvitis: Secondary | ICD-10-CM | POA: Diagnosis not present

## 2018-11-28 DIAGNOSIS — Z23 Encounter for immunization: Secondary | ICD-10-CM | POA: Diagnosis not present

## 2018-12-19 DIAGNOSIS — K5730 Diverticulosis of large intestine without perforation or abscess without bleeding: Secondary | ICD-10-CM | POA: Diagnosis not present

## 2018-12-19 DIAGNOSIS — N183 Chronic kidney disease, stage 3 unspecified: Secondary | ICD-10-CM | POA: Diagnosis not present

## 2018-12-19 DIAGNOSIS — I482 Chronic atrial fibrillation, unspecified: Secondary | ICD-10-CM | POA: Diagnosis not present

## 2018-12-19 DIAGNOSIS — E782 Mixed hyperlipidemia: Secondary | ICD-10-CM | POA: Diagnosis not present

## 2018-12-19 DIAGNOSIS — R7301 Impaired fasting glucose: Secondary | ICD-10-CM | POA: Diagnosis not present

## 2018-12-23 DIAGNOSIS — I482 Chronic atrial fibrillation, unspecified: Secondary | ICD-10-CM | POA: Diagnosis not present

## 2018-12-23 DIAGNOSIS — E782 Mixed hyperlipidemia: Secondary | ICD-10-CM | POA: Diagnosis not present

## 2018-12-23 DIAGNOSIS — N816 Rectocele: Secondary | ICD-10-CM | POA: Diagnosis not present

## 2018-12-23 DIAGNOSIS — R1310 Dysphagia, unspecified: Secondary | ICD-10-CM | POA: Diagnosis not present

## 2019-01-16 DIAGNOSIS — M47816 Spondylosis without myelopathy or radiculopathy, lumbar region: Secondary | ICD-10-CM | POA: Diagnosis not present

## 2019-01-16 DIAGNOSIS — M545 Low back pain: Secondary | ICD-10-CM | POA: Diagnosis not present

## 2019-01-16 DIAGNOSIS — M9903 Segmental and somatic dysfunction of lumbar region: Secondary | ICD-10-CM | POA: Diagnosis not present

## 2019-01-17 DIAGNOSIS — M47816 Spondylosis without myelopathy or radiculopathy, lumbar region: Secondary | ICD-10-CM | POA: Diagnosis not present

## 2019-01-17 DIAGNOSIS — M545 Low back pain: Secondary | ICD-10-CM | POA: Diagnosis not present

## 2019-01-17 DIAGNOSIS — M9903 Segmental and somatic dysfunction of lumbar region: Secondary | ICD-10-CM | POA: Diagnosis not present

## 2019-02-06 DIAGNOSIS — I482 Chronic atrial fibrillation, unspecified: Secondary | ICD-10-CM | POA: Diagnosis not present

## 2019-02-06 DIAGNOSIS — E782 Mixed hyperlipidemia: Secondary | ICD-10-CM | POA: Diagnosis not present

## 2019-02-18 DIAGNOSIS — M47816 Spondylosis without myelopathy or radiculopathy, lumbar region: Secondary | ICD-10-CM | POA: Diagnosis not present

## 2019-02-18 DIAGNOSIS — M545 Low back pain: Secondary | ICD-10-CM | POA: Diagnosis not present

## 2019-02-18 DIAGNOSIS — M9903 Segmental and somatic dysfunction of lumbar region: Secondary | ICD-10-CM | POA: Diagnosis not present

## 2019-02-21 DIAGNOSIS — M9903 Segmental and somatic dysfunction of lumbar region: Secondary | ICD-10-CM | POA: Diagnosis not present

## 2019-02-21 DIAGNOSIS — M47816 Spondylosis without myelopathy or radiculopathy, lumbar region: Secondary | ICD-10-CM | POA: Diagnosis not present

## 2019-02-21 DIAGNOSIS — M545 Low back pain: Secondary | ICD-10-CM | POA: Diagnosis not present

## 2019-02-24 DIAGNOSIS — M9903 Segmental and somatic dysfunction of lumbar region: Secondary | ICD-10-CM | POA: Diagnosis not present

## 2019-02-24 DIAGNOSIS — M47816 Spondylosis without myelopathy or radiculopathy, lumbar region: Secondary | ICD-10-CM | POA: Diagnosis not present

## 2019-02-24 DIAGNOSIS — M545 Low back pain: Secondary | ICD-10-CM | POA: Diagnosis not present

## 2019-02-28 DIAGNOSIS — M9903 Segmental and somatic dysfunction of lumbar region: Secondary | ICD-10-CM | POA: Diagnosis not present

## 2019-02-28 DIAGNOSIS — M545 Low back pain: Secondary | ICD-10-CM | POA: Diagnosis not present

## 2019-02-28 DIAGNOSIS — M47816 Spondylosis without myelopathy or radiculopathy, lumbar region: Secondary | ICD-10-CM | POA: Diagnosis not present

## 2019-03-07 DIAGNOSIS — N3001 Acute cystitis with hematuria: Secondary | ICD-10-CM | POA: Diagnosis not present

## 2019-03-07 DIAGNOSIS — E7849 Other hyperlipidemia: Secondary | ICD-10-CM | POA: Diagnosis not present

## 2019-03-07 DIAGNOSIS — I482 Chronic atrial fibrillation, unspecified: Secondary | ICD-10-CM | POA: Diagnosis not present

## 2019-03-07 DIAGNOSIS — R300 Dysuria: Secondary | ICD-10-CM | POA: Diagnosis not present

## 2019-03-10 DIAGNOSIS — N183 Chronic kidney disease, stage 3 unspecified: Secondary | ICD-10-CM | POA: Diagnosis not present

## 2019-03-10 DIAGNOSIS — R61 Generalized hyperhidrosis: Secondary | ICD-10-CM | POA: Diagnosis not present

## 2019-03-10 DIAGNOSIS — N390 Urinary tract infection, site not specified: Secondary | ICD-10-CM | POA: Diagnosis not present

## 2019-03-13 DIAGNOSIS — Z7689 Persons encountering health services in other specified circumstances: Secondary | ICD-10-CM | POA: Diagnosis not present

## 2019-03-13 DIAGNOSIS — R300 Dysuria: Secondary | ICD-10-CM | POA: Diagnosis not present

## 2019-03-13 DIAGNOSIS — R319 Hematuria, unspecified: Secondary | ICD-10-CM | POA: Diagnosis not present

## 2019-03-19 DIAGNOSIS — R319 Hematuria, unspecified: Secondary | ICD-10-CM | POA: Diagnosis not present

## 2019-03-19 DIAGNOSIS — K449 Diaphragmatic hernia without obstruction or gangrene: Secondary | ICD-10-CM | POA: Diagnosis not present

## 2019-03-19 DIAGNOSIS — I700 Atherosclerosis of aorta: Secondary | ICD-10-CM | POA: Diagnosis not present

## 2019-03-19 DIAGNOSIS — K5730 Diverticulosis of large intestine without perforation or abscess without bleeding: Secondary | ICD-10-CM | POA: Diagnosis not present

## 2019-03-24 DIAGNOSIS — I482 Chronic atrial fibrillation, unspecified: Secondary | ICD-10-CM | POA: Diagnosis not present

## 2019-03-24 DIAGNOSIS — K5730 Diverticulosis of large intestine without perforation or abscess without bleeding: Secondary | ICD-10-CM | POA: Diagnosis not present

## 2019-03-24 DIAGNOSIS — N816 Rectocele: Secondary | ICD-10-CM | POA: Diagnosis not present

## 2019-03-24 DIAGNOSIS — R7301 Impaired fasting glucose: Secondary | ICD-10-CM | POA: Diagnosis not present

## 2019-03-24 DIAGNOSIS — Z0001 Encounter for general adult medical examination with abnormal findings: Secondary | ICD-10-CM | POA: Diagnosis not present

## 2019-03-31 DIAGNOSIS — E782 Mixed hyperlipidemia: Secondary | ICD-10-CM | POA: Diagnosis not present

## 2019-03-31 DIAGNOSIS — I482 Chronic atrial fibrillation, unspecified: Secondary | ICD-10-CM | POA: Diagnosis not present

## 2019-03-31 DIAGNOSIS — R7301 Impaired fasting glucose: Secondary | ICD-10-CM | POA: Diagnosis not present

## 2019-03-31 DIAGNOSIS — E559 Vitamin D deficiency, unspecified: Secondary | ICD-10-CM | POA: Diagnosis not present

## 2019-03-31 DIAGNOSIS — N183 Chronic kidney disease, stage 3 unspecified: Secondary | ICD-10-CM | POA: Diagnosis not present

## 2019-04-01 DIAGNOSIS — N816 Rectocele: Secondary | ICD-10-CM | POA: Diagnosis not present

## 2019-04-01 DIAGNOSIS — R350 Frequency of micturition: Secondary | ICD-10-CM | POA: Diagnosis not present

## 2019-04-17 DIAGNOSIS — L814 Other melanin hyperpigmentation: Secondary | ICD-10-CM | POA: Diagnosis not present

## 2019-04-17 DIAGNOSIS — D485 Neoplasm of uncertain behavior of skin: Secondary | ICD-10-CM | POA: Diagnosis not present

## 2019-04-17 DIAGNOSIS — L570 Actinic keratosis: Secondary | ICD-10-CM | POA: Diagnosis not present

## 2019-04-17 DIAGNOSIS — L821 Other seborrheic keratosis: Secondary | ICD-10-CM | POA: Diagnosis not present

## 2019-04-17 DIAGNOSIS — L820 Inflamed seborrheic keratosis: Secondary | ICD-10-CM | POA: Diagnosis not present

## 2019-04-22 DIAGNOSIS — I482 Chronic atrial fibrillation, unspecified: Secondary | ICD-10-CM | POA: Diagnosis not present

## 2019-04-22 DIAGNOSIS — M19011 Primary osteoarthritis, right shoulder: Secondary | ICD-10-CM | POA: Diagnosis not present

## 2019-04-30 DIAGNOSIS — Z961 Presence of intraocular lens: Secondary | ICD-10-CM | POA: Diagnosis not present

## 2019-04-30 DIAGNOSIS — H10413 Chronic giant papillary conjunctivitis, bilateral: Secondary | ICD-10-CM | POA: Diagnosis not present

## 2019-04-30 DIAGNOSIS — H35372 Puckering of macula, left eye: Secondary | ICD-10-CM | POA: Diagnosis not present

## 2019-04-30 DIAGNOSIS — H43813 Vitreous degeneration, bilateral: Secondary | ICD-10-CM | POA: Diagnosis not present

## 2019-05-05 DIAGNOSIS — R61 Generalized hyperhidrosis: Secondary | ICD-10-CM | POA: Diagnosis not present

## 2019-05-05 DIAGNOSIS — I480 Paroxysmal atrial fibrillation: Secondary | ICD-10-CM | POA: Diagnosis not present

## 2019-05-05 DIAGNOSIS — Z9181 History of falling: Secondary | ICD-10-CM | POA: Diagnosis not present

## 2019-05-05 DIAGNOSIS — Z791 Long term (current) use of non-steroidal anti-inflammatories (NSAID): Secondary | ICD-10-CM | POA: Diagnosis not present

## 2019-05-05 DIAGNOSIS — G459 Transient cerebral ischemic attack, unspecified: Secondary | ICD-10-CM | POA: Diagnosis not present

## 2019-05-08 DIAGNOSIS — Z7901 Long term (current) use of anticoagulants: Secondary | ICD-10-CM | POA: Diagnosis not present

## 2019-05-08 DIAGNOSIS — R002 Palpitations: Secondary | ICD-10-CM | POA: Diagnosis not present

## 2019-05-08 DIAGNOSIS — I4821 Permanent atrial fibrillation: Secondary | ICD-10-CM | POA: Diagnosis not present

## 2019-05-14 DIAGNOSIS — M810 Age-related osteoporosis without current pathological fracture: Secondary | ICD-10-CM | POA: Diagnosis not present

## 2019-06-16 DIAGNOSIS — G8929 Other chronic pain: Secondary | ICD-10-CM | POA: Diagnosis not present

## 2019-06-16 DIAGNOSIS — M25511 Pain in right shoulder: Secondary | ICD-10-CM | POA: Diagnosis not present

## 2019-06-16 DIAGNOSIS — Z882 Allergy status to sulfonamides status: Secondary | ICD-10-CM | POA: Diagnosis not present

## 2019-06-16 DIAGNOSIS — C50911 Malignant neoplasm of unspecified site of right female breast: Secondary | ICD-10-CM | POA: Diagnosis not present

## 2019-06-16 DIAGNOSIS — M810 Age-related osteoporosis without current pathological fracture: Secondary | ICD-10-CM | POA: Diagnosis not present

## 2019-06-16 DIAGNOSIS — Z888 Allergy status to other drugs, medicaments and biological substances status: Secondary | ICD-10-CM | POA: Diagnosis not present

## 2019-08-07 DIAGNOSIS — M47816 Spondylosis without myelopathy or radiculopathy, lumbar region: Secondary | ICD-10-CM | POA: Diagnosis not present

## 2019-08-07 DIAGNOSIS — M5432 Sciatica, left side: Secondary | ICD-10-CM | POA: Diagnosis not present

## 2019-08-15 DIAGNOSIS — I4821 Permanent atrial fibrillation: Secondary | ICD-10-CM | POA: Diagnosis not present

## 2019-08-26 DIAGNOSIS — M47812 Spondylosis without myelopathy or radiculopathy, cervical region: Secondary | ICD-10-CM | POA: Diagnosis not present

## 2019-08-26 DIAGNOSIS — M4125 Other idiopathic scoliosis, thoracolumbar region: Secondary | ICD-10-CM | POA: Diagnosis not present

## 2019-08-26 DIAGNOSIS — M9903 Segmental and somatic dysfunction of lumbar region: Secondary | ICD-10-CM | POA: Diagnosis not present

## 2019-08-26 DIAGNOSIS — M9901 Segmental and somatic dysfunction of cervical region: Secondary | ICD-10-CM | POA: Diagnosis not present

## 2019-08-26 DIAGNOSIS — M9902 Segmental and somatic dysfunction of thoracic region: Secondary | ICD-10-CM | POA: Diagnosis not present

## 2019-09-02 DIAGNOSIS — C50911 Malignant neoplasm of unspecified site of right female breast: Secondary | ICD-10-CM | POA: Diagnosis not present

## 2019-09-05 DIAGNOSIS — I4821 Permanent atrial fibrillation: Secondary | ICD-10-CM | POA: Diagnosis not present

## 2019-09-07 DIAGNOSIS — I4891 Unspecified atrial fibrillation: Secondary | ICD-10-CM | POA: Diagnosis not present

## 2019-09-07 DIAGNOSIS — I472 Ventricular tachycardia: Secondary | ICD-10-CM | POA: Diagnosis not present

## 2019-09-24 DIAGNOSIS — C50911 Malignant neoplasm of unspecified site of right female breast: Secondary | ICD-10-CM | POA: Diagnosis not present

## 2019-09-25 ENCOUNTER — Telehealth: Payer: Self-pay | Admitting: Physician Assistant

## 2019-09-29 ENCOUNTER — Encounter: Payer: Self-pay | Admitting: Physician Assistant

## 2019-09-29 ENCOUNTER — Other Ambulatory Visit: Payer: Self-pay

## 2019-09-29 ENCOUNTER — Ambulatory Visit: Payer: Medicare Other | Admitting: Physician Assistant

## 2019-09-29 DIAGNOSIS — L820 Inflamed seborrheic keratosis: Secondary | ICD-10-CM | POA: Diagnosis not present

## 2019-09-29 DIAGNOSIS — Z1283 Encounter for screening for malignant neoplasm of skin: Secondary | ICD-10-CM | POA: Diagnosis not present

## 2019-09-29 DIAGNOSIS — Z85820 Personal history of malignant melanoma of skin: Secondary | ICD-10-CM

## 2019-09-29 DIAGNOSIS — Z87898 Personal history of other specified conditions: Secondary | ICD-10-CM

## 2019-09-29 DIAGNOSIS — L821 Other seborrheic keratosis: Secondary | ICD-10-CM | POA: Diagnosis not present

## 2019-09-29 DIAGNOSIS — Z85828 Personal history of other malignant neoplasm of skin: Secondary | ICD-10-CM

## 2019-09-29 DIAGNOSIS — Z86018 Personal history of other benign neoplasm: Secondary | ICD-10-CM

## 2019-09-29 DIAGNOSIS — D485 Neoplasm of uncertain behavior of skin: Secondary | ICD-10-CM | POA: Diagnosis not present

## 2019-09-29 NOTE — Progress Notes (Signed)
   Follow up Visit  Subjective  Marissa Garza is a 86 y.o. female who presents for the following: Annual Exam (no new concerns). Dark bump on right chest that she feels is new.  Objective  Well appearing patient in no apparent distress; mood and affect are within normal limits.  A full examination was performed including scalp, head, eyes, ears, nose, lips, neck, chest, axillae, abdomen, back, buttocks, bilateral upper extremities, bilateral lower extremities, hands, feet, fingers, toes, fingernails, and toenails. All findings within normal limits unless otherwise noted below. No suspicious moles noted on back.   Objective  Right Breast: Dark papule     Objective  Right Posterior Mandible: Erythematous stuck-on, waxy papule or plaque.   Objective  Right Forearm - Posterior: Dyspigmented scar. 2015  Objective  Left Shoulder - Anterior: 4 since 2011  Objective  Left Abdomen (side) - Lower: Total body skin exam  Assessment & Plan  Neoplasm of uncertain behavior of skin Right Breast  Skin / nail biopsy Type of biopsy: tangential   Informed consent: discussed and consent obtained   Timeout: patient name, date of birth, surgical site, and procedure verified   Procedure prep:  Patient was prepped and draped in usual sterile fashion (Non sterile) Prep type:  Chlorhexidine Anesthesia: the lesion was anesthetized in a standard fashion   Anesthetic:  1% lidocaine w/ epinephrine 1-100,000 local infiltration Instrument used: flexible razor blade   Outcome: patient tolerated procedure well   Post-procedure details: wound care instructions given    Specimen 1 - Surgical pathology Differential Diagnosis: r/o atypia Check Margins: No  Inflamed seborrheic keratosis Right Posterior Mandible  Destruction of lesion - Right Posterior Mandible Complexity: simple   Destruction method: cryotherapy   Informed consent: discussed and consent obtained   Timeout:  patient name,  date of birth, surgical site, and procedure verified Lesion destroyed using liquid nitrogen: Yes   Outcome: patient tolerated procedure well with no complications    Personal history of malignant melanoma of skin Right Forearm - Posterior  Treated with MOHs  History of atypical nevus Left Shoulder - Anterior  History of basal cell carcinoma (BCC) (3) Right Lower Leg - Anterior; Mid Forehead; Left Postauricular Area  Skin exam for malignant neoplasm Left Abdomen (side) - Lower

## 2019-11-03 DIAGNOSIS — M810 Age-related osteoporosis without current pathological fracture: Secondary | ICD-10-CM | POA: Diagnosis not present

## 2019-11-03 DIAGNOSIS — I4821 Permanent atrial fibrillation: Secondary | ICD-10-CM | POA: Diagnosis not present

## 2019-11-03 DIAGNOSIS — Z9889 Other specified postprocedural states: Secondary | ICD-10-CM | POA: Diagnosis not present

## 2019-11-03 DIAGNOSIS — I129 Hypertensive chronic kidney disease with stage 1 through stage 4 chronic kidney disease, or unspecified chronic kidney disease: Secondary | ICD-10-CM | POA: Diagnosis not present

## 2019-11-03 DIAGNOSIS — Z992 Dependence on renal dialysis: Secondary | ICD-10-CM | POA: Diagnosis not present

## 2019-11-03 DIAGNOSIS — K219 Gastro-esophageal reflux disease without esophagitis: Secondary | ICD-10-CM | POA: Diagnosis not present

## 2019-11-03 DIAGNOSIS — N189 Chronic kidney disease, unspecified: Secondary | ICD-10-CM | POA: Diagnosis not present

## 2019-11-03 DIAGNOSIS — K449 Diaphragmatic hernia without obstruction or gangrene: Secondary | ICD-10-CM | POA: Diagnosis not present

## 2019-11-03 DIAGNOSIS — Z8679 Personal history of other diseases of the circulatory system: Secondary | ICD-10-CM | POA: Diagnosis not present

## 2019-11-03 DIAGNOSIS — Z8673 Personal history of transient ischemic attack (TIA), and cerebral infarction without residual deficits: Secondary | ICD-10-CM | POA: Diagnosis not present

## 2019-11-03 DIAGNOSIS — Z9181 History of falling: Secondary | ICD-10-CM | POA: Diagnosis not present

## 2019-11-03 DIAGNOSIS — R7303 Prediabetes: Secondary | ICD-10-CM | POA: Diagnosis not present

## 2019-11-03 DIAGNOSIS — Z853 Personal history of malignant neoplasm of breast: Secondary | ICD-10-CM | POA: Diagnosis not present

## 2019-11-03 DIAGNOSIS — Z79899 Other long term (current) drug therapy: Secondary | ICD-10-CM | POA: Diagnosis not present

## 2019-11-07 ENCOUNTER — Ambulatory Visit: Payer: Medicare Other | Admitting: Physician Assistant

## 2019-11-24 DIAGNOSIS — M810 Age-related osteoporosis without current pathological fracture: Secondary | ICD-10-CM | POA: Diagnosis not present

## 2019-12-05 DIAGNOSIS — R197 Diarrhea, unspecified: Secondary | ICD-10-CM | POA: Diagnosis not present

## 2019-12-08 DIAGNOSIS — Z882 Allergy status to sulfonamides status: Secondary | ICD-10-CM | POA: Diagnosis not present

## 2019-12-08 DIAGNOSIS — Z881 Allergy status to other antibiotic agents status: Secondary | ICD-10-CM | POA: Diagnosis not present

## 2019-12-08 DIAGNOSIS — Z888 Allergy status to other drugs, medicaments and biological substances status: Secondary | ICD-10-CM | POA: Diagnosis not present

## 2019-12-08 DIAGNOSIS — K5792 Diverticulitis of intestine, part unspecified, without perforation or abscess without bleeding: Secondary | ICD-10-CM | POA: Diagnosis not present

## 2019-12-08 DIAGNOSIS — G4700 Insomnia, unspecified: Secondary | ICD-10-CM | POA: Diagnosis not present

## 2019-12-22 DIAGNOSIS — I4821 Permanent atrial fibrillation: Secondary | ICD-10-CM | POA: Diagnosis not present

## 2019-12-22 DIAGNOSIS — G4700 Insomnia, unspecified: Secondary | ICD-10-CM | POA: Diagnosis not present

## 2019-12-22 DIAGNOSIS — M810 Age-related osteoporosis without current pathological fracture: Secondary | ICD-10-CM | POA: Diagnosis not present

## 2019-12-22 DIAGNOSIS — E785 Hyperlipidemia, unspecified: Secondary | ICD-10-CM | POA: Diagnosis not present

## 2019-12-22 DIAGNOSIS — Z853 Personal history of malignant neoplasm of breast: Secondary | ICD-10-CM | POA: Diagnosis not present

## 2019-12-22 DIAGNOSIS — K5792 Diverticulitis of intestine, part unspecified, without perforation or abscess without bleeding: Secondary | ICD-10-CM | POA: Diagnosis not present

## 2019-12-22 DIAGNOSIS — M1710 Unilateral primary osteoarthritis, unspecified knee: Secondary | ICD-10-CM | POA: Diagnosis not present

## 2019-12-22 DIAGNOSIS — Z8673 Personal history of transient ischemic attack (TIA), and cerebral infarction without residual deficits: Secondary | ICD-10-CM | POA: Diagnosis not present

## 2020-03-02 ENCOUNTER — Ambulatory Visit: Payer: Medicare Other | Admitting: Physician Assistant

## 2020-03-02 ENCOUNTER — Encounter: Payer: Self-pay | Admitting: Physician Assistant

## 2020-03-02 ENCOUNTER — Other Ambulatory Visit: Payer: Self-pay

## 2020-03-02 DIAGNOSIS — Z1283 Encounter for screening for malignant neoplasm of skin: Secondary | ICD-10-CM | POA: Diagnosis not present

## 2020-03-02 DIAGNOSIS — Z85820 Personal history of malignant melanoma of skin: Secondary | ICD-10-CM

## 2020-03-02 DIAGNOSIS — L309 Dermatitis, unspecified: Secondary | ICD-10-CM | POA: Diagnosis not present

## 2020-03-02 MED ORDER — CLOBETASOL PROP EMOLLIENT BASE 0.05 % EX CREA: 1.0000 "application " | TOPICAL_CREAM | Freq: Two times a day (BID) | CUTANEOUS | 8 refills | Status: DC

## 2020-03-02 MED ORDER — CLOBETASOL PROPIONATE 0.05 % EX CREA: 1.0000 "application " | TOPICAL_CREAM | CUTANEOUS | 6 refills | Status: DC

## 2020-03-02 MED ORDER — CLOBETASOL PROPIONATE 0.05 % EX SOLN: 1.0000 "application " | Freq: Two times a day (BID) | CUTANEOUS | 0 refills | Status: AC

## 2020-03-02 NOTE — Progress Notes (Signed)
   Follow-Up Visit   Subjective  Marissa Garza is a 87 y.o. female who presents for the following: Annual Exam (No new concerns).   The following portions of the chart were reviewed this encounter and updated as appropriate:  Allergies  Meds  Problems      Objective  Well appearing patient in no apparent distress; mood and affect are within normal limits.  A full examination was performed including scalp, head, eyes, ears, nose, lips, neck, chest, axillae, abdomen, back, buttocks, bilateral upper extremities, bilateral lower extremities, hands, feet, fingers, toes, fingernails, and toenails. All findings within normal limits unless otherwise noted below. No LAD  Objective  Neck - Anterior: Full body skin examination- no atypical moles or non mole skin cancer  Objective  Right Forearm - Anterior: Dyspigmented scar.   Objective  scalp: Thin scaly erythematous papules coalescing to plaques.   Assessment & Plan  Encounter for screening for malignant neoplasm of skin Neck - Anterior  Yearly skin check  Personal history of malignant melanoma of skin Right Forearm - Anterior  6 mo skin check  Dermatitis scalp   I, Khamauri Bauernfeind, PA-C, have reviewed all documentation's for this visit.  The documentation on 03/02/20 for the exam, diagnosis, procedures and orders are all accurate and complete.

## 2020-03-20 DIAGNOSIS — L259 Unspecified contact dermatitis, unspecified cause: Secondary | ICD-10-CM | POA: Diagnosis not present

## 2020-03-23 DIAGNOSIS — M9901 Segmental and somatic dysfunction of cervical region: Secondary | ICD-10-CM | POA: Diagnosis not present

## 2020-03-23 DIAGNOSIS — M9903 Segmental and somatic dysfunction of lumbar region: Secondary | ICD-10-CM | POA: Diagnosis not present

## 2020-03-23 DIAGNOSIS — M4125 Other idiopathic scoliosis, thoracolumbar region: Secondary | ICD-10-CM | POA: Diagnosis not present

## 2020-03-23 DIAGNOSIS — M9902 Segmental and somatic dysfunction of thoracic region: Secondary | ICD-10-CM | POA: Diagnosis not present

## 2020-03-23 DIAGNOSIS — M5442 Lumbago with sciatica, left side: Secondary | ICD-10-CM | POA: Diagnosis not present

## 2020-03-23 DIAGNOSIS — M47812 Spondylosis without myelopathy or radiculopathy, cervical region: Secondary | ICD-10-CM | POA: Diagnosis not present

## 2020-03-23 DIAGNOSIS — M47816 Spondylosis without myelopathy or radiculopathy, lumbar region: Secondary | ICD-10-CM | POA: Diagnosis not present

## 2020-03-31 DIAGNOSIS — M9901 Segmental and somatic dysfunction of cervical region: Secondary | ICD-10-CM | POA: Diagnosis not present

## 2020-03-31 DIAGNOSIS — M47816 Spondylosis without myelopathy or radiculopathy, lumbar region: Secondary | ICD-10-CM | POA: Diagnosis not present

## 2020-03-31 DIAGNOSIS — M47812 Spondylosis without myelopathy or radiculopathy, cervical region: Secondary | ICD-10-CM | POA: Diagnosis not present

## 2020-03-31 DIAGNOSIS — M4125 Other idiopathic scoliosis, thoracolumbar region: Secondary | ICD-10-CM | POA: Diagnosis not present

## 2020-03-31 DIAGNOSIS — M9902 Segmental and somatic dysfunction of thoracic region: Secondary | ICD-10-CM | POA: Diagnosis not present

## 2020-03-31 DIAGNOSIS — M9903 Segmental and somatic dysfunction of lumbar region: Secondary | ICD-10-CM | POA: Diagnosis not present

## 2020-03-31 DIAGNOSIS — M5442 Lumbago with sciatica, left side: Secondary | ICD-10-CM | POA: Diagnosis not present

## 2020-04-05 DIAGNOSIS — Z853 Personal history of malignant neoplasm of breast: Secondary | ICD-10-CM | POA: Diagnosis not present

## 2020-04-05 DIAGNOSIS — Z7901 Long term (current) use of anticoagulants: Secondary | ICD-10-CM | POA: Diagnosis not present

## 2020-04-05 DIAGNOSIS — I119 Hypertensive heart disease without heart failure: Secondary | ICD-10-CM | POA: Diagnosis not present

## 2020-04-05 DIAGNOSIS — M810 Age-related osteoporosis without current pathological fracture: Secondary | ICD-10-CM | POA: Diagnosis not present

## 2020-04-05 DIAGNOSIS — Z79899 Other long term (current) drug therapy: Secondary | ICD-10-CM | POA: Diagnosis not present

## 2020-04-05 DIAGNOSIS — M545 Low back pain, unspecified: Secondary | ICD-10-CM | POA: Diagnosis not present

## 2020-04-05 DIAGNOSIS — N816 Rectocele: Secondary | ICD-10-CM | POA: Diagnosis not present

## 2020-04-05 DIAGNOSIS — R2689 Other abnormalities of gait and mobility: Secondary | ICD-10-CM | POA: Diagnosis not present

## 2020-04-05 DIAGNOSIS — G4700 Insomnia, unspecified: Secondary | ICD-10-CM | POA: Diagnosis not present

## 2020-04-05 DIAGNOSIS — E7800 Pure hypercholesterolemia, unspecified: Secondary | ICD-10-CM | POA: Diagnosis not present

## 2020-04-05 DIAGNOSIS — M8588 Other specified disorders of bone density and structure, other site: Secondary | ICD-10-CM | POA: Diagnosis not present

## 2020-04-05 DIAGNOSIS — I4891 Unspecified atrial fibrillation: Secondary | ICD-10-CM | POA: Diagnosis not present

## 2020-04-07 DIAGNOSIS — M47812 Spondylosis without myelopathy or radiculopathy, cervical region: Secondary | ICD-10-CM | POA: Diagnosis not present

## 2020-04-07 DIAGNOSIS — M9903 Segmental and somatic dysfunction of lumbar region: Secondary | ICD-10-CM | POA: Diagnosis not present

## 2020-04-07 DIAGNOSIS — M5442 Lumbago with sciatica, left side: Secondary | ICD-10-CM | POA: Diagnosis not present

## 2020-04-07 DIAGNOSIS — M47816 Spondylosis without myelopathy or radiculopathy, lumbar region: Secondary | ICD-10-CM | POA: Diagnosis not present

## 2020-04-07 DIAGNOSIS — M4125 Other idiopathic scoliosis, thoracolumbar region: Secondary | ICD-10-CM | POA: Diagnosis not present

## 2020-04-07 DIAGNOSIS — M9901 Segmental and somatic dysfunction of cervical region: Secondary | ICD-10-CM | POA: Diagnosis not present

## 2020-04-07 DIAGNOSIS — M9902 Segmental and somatic dysfunction of thoracic region: Secondary | ICD-10-CM | POA: Diagnosis not present

## 2020-04-15 DIAGNOSIS — M9903 Segmental and somatic dysfunction of lumbar region: Secondary | ICD-10-CM | POA: Diagnosis not present

## 2020-04-15 DIAGNOSIS — M5431 Sciatica, right side: Secondary | ICD-10-CM | POA: Diagnosis not present

## 2020-04-15 DIAGNOSIS — M9905 Segmental and somatic dysfunction of pelvic region: Secondary | ICD-10-CM | POA: Diagnosis not present

## 2020-04-15 DIAGNOSIS — M9902 Segmental and somatic dysfunction of thoracic region: Secondary | ICD-10-CM | POA: Diagnosis not present

## 2020-04-19 DIAGNOSIS — M9903 Segmental and somatic dysfunction of lumbar region: Secondary | ICD-10-CM | POA: Diagnosis not present

## 2020-04-19 DIAGNOSIS — M9905 Segmental and somatic dysfunction of pelvic region: Secondary | ICD-10-CM | POA: Diagnosis not present

## 2020-04-19 DIAGNOSIS — M9902 Segmental and somatic dysfunction of thoracic region: Secondary | ICD-10-CM | POA: Diagnosis not present

## 2020-04-19 DIAGNOSIS — M5431 Sciatica, right side: Secondary | ICD-10-CM | POA: Diagnosis not present

## 2020-04-21 DIAGNOSIS — M9905 Segmental and somatic dysfunction of pelvic region: Secondary | ICD-10-CM | POA: Diagnosis not present

## 2020-04-21 DIAGNOSIS — M5431 Sciatica, right side: Secondary | ICD-10-CM | POA: Diagnosis not present

## 2020-04-21 DIAGNOSIS — M9903 Segmental and somatic dysfunction of lumbar region: Secondary | ICD-10-CM | POA: Diagnosis not present

## 2020-04-21 DIAGNOSIS — M9902 Segmental and somatic dysfunction of thoracic region: Secondary | ICD-10-CM | POA: Diagnosis not present

## 2020-04-26 DIAGNOSIS — M9903 Segmental and somatic dysfunction of lumbar region: Secondary | ICD-10-CM | POA: Diagnosis not present

## 2020-04-26 DIAGNOSIS — M9902 Segmental and somatic dysfunction of thoracic region: Secondary | ICD-10-CM | POA: Diagnosis not present

## 2020-04-26 DIAGNOSIS — M9905 Segmental and somatic dysfunction of pelvic region: Secondary | ICD-10-CM | POA: Diagnosis not present

## 2020-04-26 DIAGNOSIS — M5431 Sciatica, right side: Secondary | ICD-10-CM | POA: Diagnosis not present

## 2020-04-28 DIAGNOSIS — M9905 Segmental and somatic dysfunction of pelvic region: Secondary | ICD-10-CM | POA: Diagnosis not present

## 2020-04-28 DIAGNOSIS — M5431 Sciatica, right side: Secondary | ICD-10-CM | POA: Diagnosis not present

## 2020-04-28 DIAGNOSIS — M9903 Segmental and somatic dysfunction of lumbar region: Secondary | ICD-10-CM | POA: Diagnosis not present

## 2020-04-28 DIAGNOSIS — M9902 Segmental and somatic dysfunction of thoracic region: Secondary | ICD-10-CM | POA: Diagnosis not present

## 2020-05-03 DIAGNOSIS — M9903 Segmental and somatic dysfunction of lumbar region: Secondary | ICD-10-CM | POA: Diagnosis not present

## 2020-05-03 DIAGNOSIS — M9902 Segmental and somatic dysfunction of thoracic region: Secondary | ICD-10-CM | POA: Diagnosis not present

## 2020-05-03 DIAGNOSIS — M5431 Sciatica, right side: Secondary | ICD-10-CM | POA: Diagnosis not present

## 2020-05-03 DIAGNOSIS — M9905 Segmental and somatic dysfunction of pelvic region: Secondary | ICD-10-CM | POA: Diagnosis not present

## 2020-05-05 DIAGNOSIS — M9902 Segmental and somatic dysfunction of thoracic region: Secondary | ICD-10-CM | POA: Diagnosis not present

## 2020-05-05 DIAGNOSIS — M9905 Segmental and somatic dysfunction of pelvic region: Secondary | ICD-10-CM | POA: Diagnosis not present

## 2020-05-05 DIAGNOSIS — M9903 Segmental and somatic dysfunction of lumbar region: Secondary | ICD-10-CM | POA: Diagnosis not present

## 2020-05-05 DIAGNOSIS — M5431 Sciatica, right side: Secondary | ICD-10-CM | POA: Diagnosis not present

## 2020-05-10 DIAGNOSIS — Z7901 Long term (current) use of anticoagulants: Secondary | ICD-10-CM | POA: Diagnosis not present

## 2020-05-10 DIAGNOSIS — Z79899 Other long term (current) drug therapy: Secondary | ICD-10-CM | POA: Diagnosis not present

## 2020-05-10 DIAGNOSIS — M9902 Segmental and somatic dysfunction of thoracic region: Secondary | ICD-10-CM | POA: Diagnosis not present

## 2020-05-10 DIAGNOSIS — I4891 Unspecified atrial fibrillation: Secondary | ICD-10-CM | POA: Diagnosis not present

## 2020-05-10 DIAGNOSIS — M9905 Segmental and somatic dysfunction of pelvic region: Secondary | ICD-10-CM | POA: Diagnosis not present

## 2020-05-10 DIAGNOSIS — M5431 Sciatica, right side: Secondary | ICD-10-CM | POA: Diagnosis not present

## 2020-05-10 DIAGNOSIS — I4821 Permanent atrial fibrillation: Secondary | ICD-10-CM | POA: Diagnosis not present

## 2020-05-10 DIAGNOSIS — E7800 Pure hypercholesterolemia, unspecified: Secondary | ICD-10-CM | POA: Diagnosis not present

## 2020-05-10 DIAGNOSIS — M9903 Segmental and somatic dysfunction of lumbar region: Secondary | ICD-10-CM | POA: Diagnosis not present

## 2020-05-12 DIAGNOSIS — M5431 Sciatica, right side: Secondary | ICD-10-CM | POA: Diagnosis not present

## 2020-05-12 DIAGNOSIS — M9903 Segmental and somatic dysfunction of lumbar region: Secondary | ICD-10-CM | POA: Diagnosis not present

## 2020-05-12 DIAGNOSIS — M9902 Segmental and somatic dysfunction of thoracic region: Secondary | ICD-10-CM | POA: Diagnosis not present

## 2020-05-12 DIAGNOSIS — M9905 Segmental and somatic dysfunction of pelvic region: Secondary | ICD-10-CM | POA: Diagnosis not present

## 2020-05-17 DIAGNOSIS — M5431 Sciatica, right side: Secondary | ICD-10-CM | POA: Diagnosis not present

## 2020-05-17 DIAGNOSIS — M9903 Segmental and somatic dysfunction of lumbar region: Secondary | ICD-10-CM | POA: Diagnosis not present

## 2020-05-17 DIAGNOSIS — M9902 Segmental and somatic dysfunction of thoracic region: Secondary | ICD-10-CM | POA: Diagnosis not present

## 2020-05-17 DIAGNOSIS — M9905 Segmental and somatic dysfunction of pelvic region: Secondary | ICD-10-CM | POA: Diagnosis not present

## 2020-05-19 DIAGNOSIS — M9903 Segmental and somatic dysfunction of lumbar region: Secondary | ICD-10-CM | POA: Diagnosis not present

## 2020-05-19 DIAGNOSIS — M9902 Segmental and somatic dysfunction of thoracic region: Secondary | ICD-10-CM | POA: Diagnosis not present

## 2020-05-19 DIAGNOSIS — M5431 Sciatica, right side: Secondary | ICD-10-CM | POA: Diagnosis not present

## 2020-05-19 DIAGNOSIS — M9905 Segmental and somatic dysfunction of pelvic region: Secondary | ICD-10-CM | POA: Diagnosis not present

## 2020-05-24 DIAGNOSIS — M810 Age-related osteoporosis without current pathological fracture: Secondary | ICD-10-CM | POA: Diagnosis not present

## 2020-05-24 DIAGNOSIS — M9905 Segmental and somatic dysfunction of pelvic region: Secondary | ICD-10-CM | POA: Diagnosis not present

## 2020-05-24 DIAGNOSIS — M9903 Segmental and somatic dysfunction of lumbar region: Secondary | ICD-10-CM | POA: Diagnosis not present

## 2020-05-24 DIAGNOSIS — M5431 Sciatica, right side: Secondary | ICD-10-CM | POA: Diagnosis not present

## 2020-05-24 DIAGNOSIS — M9902 Segmental and somatic dysfunction of thoracic region: Secondary | ICD-10-CM | POA: Diagnosis not present

## 2020-05-31 DIAGNOSIS — M9902 Segmental and somatic dysfunction of thoracic region: Secondary | ICD-10-CM | POA: Diagnosis not present

## 2020-05-31 DIAGNOSIS — M9903 Segmental and somatic dysfunction of lumbar region: Secondary | ICD-10-CM | POA: Diagnosis not present

## 2020-05-31 DIAGNOSIS — M9905 Segmental and somatic dysfunction of pelvic region: Secondary | ICD-10-CM | POA: Diagnosis not present

## 2020-05-31 DIAGNOSIS — M5431 Sciatica, right side: Secondary | ICD-10-CM | POA: Diagnosis not present

## 2020-05-31 NOTE — Telephone Encounter (Signed)
error 

## 2020-06-04 DIAGNOSIS — M9905 Segmental and somatic dysfunction of pelvic region: Secondary | ICD-10-CM | POA: Diagnosis not present

## 2020-06-04 DIAGNOSIS — M5431 Sciatica, right side: Secondary | ICD-10-CM | POA: Diagnosis not present

## 2020-06-04 DIAGNOSIS — M9903 Segmental and somatic dysfunction of lumbar region: Secondary | ICD-10-CM | POA: Diagnosis not present

## 2020-06-04 DIAGNOSIS — M9902 Segmental and somatic dysfunction of thoracic region: Secondary | ICD-10-CM | POA: Diagnosis not present

## 2020-06-07 DIAGNOSIS — M9903 Segmental and somatic dysfunction of lumbar region: Secondary | ICD-10-CM | POA: Diagnosis not present

## 2020-06-07 DIAGNOSIS — M9902 Segmental and somatic dysfunction of thoracic region: Secondary | ICD-10-CM | POA: Diagnosis not present

## 2020-06-07 DIAGNOSIS — M9905 Segmental and somatic dysfunction of pelvic region: Secondary | ICD-10-CM | POA: Diagnosis not present

## 2020-06-07 DIAGNOSIS — M5431 Sciatica, right side: Secondary | ICD-10-CM | POA: Diagnosis not present

## 2020-06-10 DIAGNOSIS — M9905 Segmental and somatic dysfunction of pelvic region: Secondary | ICD-10-CM | POA: Diagnosis not present

## 2020-06-10 DIAGNOSIS — M5431 Sciatica, right side: Secondary | ICD-10-CM | POA: Diagnosis not present

## 2020-06-10 DIAGNOSIS — M9902 Segmental and somatic dysfunction of thoracic region: Secondary | ICD-10-CM | POA: Diagnosis not present

## 2020-06-10 DIAGNOSIS — M9903 Segmental and somatic dysfunction of lumbar region: Secondary | ICD-10-CM | POA: Diagnosis not present

## 2020-07-07 DIAGNOSIS — M5431 Sciatica, right side: Secondary | ICD-10-CM | POA: Diagnosis not present

## 2020-07-07 DIAGNOSIS — M9902 Segmental and somatic dysfunction of thoracic region: Secondary | ICD-10-CM | POA: Diagnosis not present

## 2020-07-07 DIAGNOSIS — M9905 Segmental and somatic dysfunction of pelvic region: Secondary | ICD-10-CM | POA: Diagnosis not present

## 2020-07-07 DIAGNOSIS — M9903 Segmental and somatic dysfunction of lumbar region: Secondary | ICD-10-CM | POA: Diagnosis not present

## 2020-07-12 DIAGNOSIS — G459 Transient cerebral ischemic attack, unspecified: Secondary | ICD-10-CM | POA: Diagnosis not present

## 2020-07-12 DIAGNOSIS — Z8673 Personal history of transient ischemic attack (TIA), and cerebral infarction without residual deficits: Secondary | ICD-10-CM | POA: Diagnosis not present

## 2020-07-12 DIAGNOSIS — E7800 Pure hypercholesterolemia, unspecified: Secondary | ICD-10-CM | POA: Diagnosis not present

## 2020-07-12 DIAGNOSIS — R413 Other amnesia: Secondary | ICD-10-CM | POA: Diagnosis not present

## 2020-07-12 DIAGNOSIS — I10 Essential (primary) hypertension: Secondary | ICD-10-CM | POA: Diagnosis not present

## 2020-07-12 DIAGNOSIS — I4891 Unspecified atrial fibrillation: Secondary | ICD-10-CM | POA: Diagnosis not present

## 2020-07-12 DIAGNOSIS — E782 Mixed hyperlipidemia: Secondary | ICD-10-CM | POA: Diagnosis not present

## 2020-07-12 DIAGNOSIS — F5101 Primary insomnia: Secondary | ICD-10-CM | POA: Diagnosis not present

## 2020-08-05 DIAGNOSIS — M9903 Segmental and somatic dysfunction of lumbar region: Secondary | ICD-10-CM | POA: Diagnosis not present

## 2020-08-05 DIAGNOSIS — M5431 Sciatica, right side: Secondary | ICD-10-CM | POA: Diagnosis not present

## 2020-08-05 DIAGNOSIS — M9905 Segmental and somatic dysfunction of pelvic region: Secondary | ICD-10-CM | POA: Diagnosis not present

## 2020-08-05 DIAGNOSIS — M9902 Segmental and somatic dysfunction of thoracic region: Secondary | ICD-10-CM | POA: Diagnosis not present

## 2020-08-26 DIAGNOSIS — M5431 Sciatica, right side: Secondary | ICD-10-CM | POA: Diagnosis not present

## 2020-08-26 DIAGNOSIS — M9903 Segmental and somatic dysfunction of lumbar region: Secondary | ICD-10-CM | POA: Diagnosis not present

## 2020-08-26 DIAGNOSIS — M9902 Segmental and somatic dysfunction of thoracic region: Secondary | ICD-10-CM | POA: Diagnosis not present

## 2020-08-26 DIAGNOSIS — M9905 Segmental and somatic dysfunction of pelvic region: Secondary | ICD-10-CM | POA: Diagnosis not present

## 2020-08-31 ENCOUNTER — Encounter: Payer: Self-pay | Admitting: Physician Assistant

## 2020-08-31 ENCOUNTER — Ambulatory Visit: Payer: Medicare Other | Admitting: Physician Assistant

## 2020-08-31 DIAGNOSIS — Z85828 Personal history of other malignant neoplasm of skin: Secondary | ICD-10-CM | POA: Diagnosis not present

## 2020-08-31 DIAGNOSIS — D485 Neoplasm of uncertain behavior of skin: Secondary | ICD-10-CM | POA: Diagnosis not present

## 2020-08-31 DIAGNOSIS — Z1283 Encounter for screening for malignant neoplasm of skin: Secondary | ICD-10-CM | POA: Diagnosis not present

## 2020-08-31 DIAGNOSIS — Z85820 Personal history of malignant melanoma of skin: Secondary | ICD-10-CM

## 2020-08-31 DIAGNOSIS — L570 Actinic keratosis: Secondary | ICD-10-CM | POA: Diagnosis not present

## 2020-08-31 DIAGNOSIS — L821 Other seborrheic keratosis: Secondary | ICD-10-CM | POA: Diagnosis not present

## 2020-08-31 NOTE — Patient Instructions (Signed)

## 2020-09-01 ENCOUNTER — Telehealth: Payer: Self-pay | Admitting: Physician Assistant

## 2020-09-01 ENCOUNTER — Encounter: Payer: Self-pay | Admitting: Physician Assistant

## 2020-09-01 MED ORDER — CLOBETASOL PROPIONATE 0.05 % EX SOLN: 1.0000 "application " | Freq: Two times a day (BID) | CUTANEOUS | 6 refills | Status: AC

## 2020-09-01 NOTE — Progress Notes (Signed)
   Follow-Up Visit   Subjective  Marissa Garza is a 87 y.o. female who presents for the following: Follow-up (6 month follow up right under eye  is a persistent brownish pin lesion. History of nonmole skin cancers and MM.    The following portions of the chart were reviewed this encounter and updated as appropriate:  Tobacco  Allergies  Meds  Problems  Med Hx  Surg Hx  Fam Hx      Objective  Well appearing patient in no apparent distress; mood and affect are within normal limits.  A full examination was performed including scalp, head, eyes, ears, nose, lips, neck, chest, axillae, abdomen, back, buttocks, bilateral upper extremities, bilateral lower extremities, hands, feet, fingers, toes, fingernails, and toenails. All findings within normal limits unless otherwise noted below. No atypical moles noted at the visit.  Left Wrist - Posterior Hyperkeratotic scale with pink base        Right Malar Cheek Hyperkeratotic scale with pink base        Right Malar Cheek Erythematous patches with gritty scale.   Assessment & Plan  Neoplasm of uncertain behavior of skin (2) Left Wrist - Posterior  Skin / nail biopsy Type of biopsy: tangential   Informed consent: discussed and consent obtained   Timeout: patient name, date of birth, surgical site, and procedure verified   Procedure prep:  Patient was prepped and draped in usual sterile fashion (Non sterile) Prep type:  Chlorhexidine Anesthesia: the lesion was anesthetized in a standard fashion   Anesthetic:  1% lidocaine w/ epinephrine 1-100,000 local infiltration Instrument used: flexible razor blade   Hemostasis achieved with: aluminum chloride   Outcome: patient tolerated procedure well   Post-procedure details: sterile dressing applied and wound care instructions given   Dressing type: bandage and petrolatum    Specimen 1 - Surgical pathology Differential Diagnosis: R/O Atypia - cautery after biopsy  Check  Margins: Yes  Right Malar Cheek  Skin / nail biopsy Type of biopsy: tangential   Informed consent: discussed and consent obtained   Timeout: patient name, date of birth, surgical site, and procedure verified   Procedure prep:  Patient was prepped and draped in usual sterile fashion (Non sterile) Prep type:  Chlorhexidine Anesthesia: the lesion was anesthetized in a standard fashion   Anesthetic:  1% lidocaine w/ epinephrine 1-100,000 local infiltration Instrument used: flexible razor blade   Hemostasis achieved with: aluminum chloride   Outcome: patient tolerated procedure well   Post-procedure details: sterile dressing applied and wound care instructions given   Dressing type: bandage and petrolatum    Specimen 2 - Surgical pathology Differential Diagnosis: R/O BCC vs SCC  Check Margins: Yes  AK (actinic keratosis) Right Malar Cheek  Destruction of lesion - Right Malar Cheek Complexity: simple   Destruction method: cryotherapy   Informed consent: discussed and consent obtained   Timeout:  patient name, date of birth, surgical site, and procedure verified Lesion destroyed using liquid nitrogen: Yes   Cryotherapy cycles:  1 Outcome: patient tolerated procedure well with no complications   Post-procedure details: wound care instructions given      I, Teofila Bowery, PA-C, have reviewed all documentation's for this visit.  The documentation on 09/01/20 for the exam, diagnosis, procedures and orders are all accurate and complete.

## 2020-09-01 NOTE — Telephone Encounter (Signed)
Patient is calling to say that she came in for an office visit yesterday, 08/31/2020, with Robyne Askew, PA-C and she was supposed get a prescription sent in for itchy scalp, but she and Vida Roller got side tracked and never discussed the medication any further.  Patient [place] [facility] [place]

## 2020-09-08 DIAGNOSIS — M9902 Segmental and somatic dysfunction of thoracic region: Secondary | ICD-10-CM | POA: Diagnosis not present

## 2020-09-08 DIAGNOSIS — M9903 Segmental and somatic dysfunction of lumbar region: Secondary | ICD-10-CM | POA: Diagnosis not present

## 2020-09-08 DIAGNOSIS — M5431 Sciatica, right side: Secondary | ICD-10-CM | POA: Diagnosis not present

## 2020-09-08 DIAGNOSIS — M9905 Segmental and somatic dysfunction of pelvic region: Secondary | ICD-10-CM | POA: Diagnosis not present

## 2020-09-15 DIAGNOSIS — M9905 Segmental and somatic dysfunction of pelvic region: Secondary | ICD-10-CM | POA: Diagnosis not present

## 2020-09-15 DIAGNOSIS — M9902 Segmental and somatic dysfunction of thoracic region: Secondary | ICD-10-CM | POA: Diagnosis not present

## 2020-09-15 DIAGNOSIS — M5431 Sciatica, right side: Secondary | ICD-10-CM | POA: Diagnosis not present

## 2020-09-15 DIAGNOSIS — M9903 Segmental and somatic dysfunction of lumbar region: Secondary | ICD-10-CM | POA: Diagnosis not present

## 2020-09-16 DIAGNOSIS — E782 Mixed hyperlipidemia: Secondary | ICD-10-CM | POA: Diagnosis not present

## 2020-09-16 DIAGNOSIS — I1310 Hypertensive heart and chronic kidney disease without heart failure, with stage 1 through stage 4 chronic kidney disease, or unspecified chronic kidney disease: Secondary | ICD-10-CM | POA: Diagnosis not present

## 2020-09-16 DIAGNOSIS — Z79899 Other long term (current) drug therapy: Secondary | ICD-10-CM | POA: Diagnosis not present

## 2020-09-16 DIAGNOSIS — I119 Hypertensive heart disease without heart failure: Secondary | ICD-10-CM | POA: Diagnosis not present

## 2020-09-16 DIAGNOSIS — Z7901 Long term (current) use of anticoagulants: Secondary | ICD-10-CM | POA: Diagnosis not present

## 2020-09-16 DIAGNOSIS — I4821 Permanent atrial fibrillation: Secondary | ICD-10-CM | POA: Diagnosis not present

## 2020-09-17 DIAGNOSIS — I499 Cardiac arrhythmia, unspecified: Secondary | ICD-10-CM | POA: Diagnosis not present

## 2020-09-17 DIAGNOSIS — I4821 Permanent atrial fibrillation: Secondary | ICD-10-CM | POA: Diagnosis not present

## 2020-10-18 DIAGNOSIS — M810 Age-related osteoporosis without current pathological fracture: Secondary | ICD-10-CM | POA: Diagnosis not present

## 2020-10-18 DIAGNOSIS — Z23 Encounter for immunization: Secondary | ICD-10-CM | POA: Diagnosis not present

## 2020-10-18 DIAGNOSIS — R7309 Other abnormal glucose: Secondary | ICD-10-CM | POA: Diagnosis not present

## 2020-10-18 DIAGNOSIS — E782 Mixed hyperlipidemia: Secondary | ICD-10-CM | POA: Diagnosis not present

## 2020-10-18 DIAGNOSIS — I4821 Permanent atrial fibrillation: Secondary | ICD-10-CM | POA: Diagnosis not present

## 2020-10-18 DIAGNOSIS — Z791 Long term (current) use of non-steroidal anti-inflammatories (NSAID): Secondary | ICD-10-CM | POA: Diagnosis not present

## 2020-10-18 DIAGNOSIS — K623 Rectal prolapse: Secondary | ICD-10-CM | POA: Diagnosis not present

## 2020-10-18 DIAGNOSIS — R413 Other amnesia: Secondary | ICD-10-CM | POA: Diagnosis not present

## 2020-10-18 DIAGNOSIS — Z7901 Long term (current) use of anticoagulants: Secondary | ICD-10-CM | POA: Diagnosis not present

## 2020-10-18 DIAGNOSIS — M545 Low back pain, unspecified: Secondary | ICD-10-CM | POA: Diagnosis not present

## 2020-10-18 DIAGNOSIS — Z79899 Other long term (current) drug therapy: Secondary | ICD-10-CM | POA: Diagnosis not present

## 2020-10-18 DIAGNOSIS — I10 Essential (primary) hypertension: Secondary | ICD-10-CM | POA: Diagnosis not present

## 2020-10-18 DIAGNOSIS — Z8673 Personal history of transient ischemic attack (TIA), and cerebral infarction without residual deficits: Secondary | ICD-10-CM | POA: Diagnosis not present

## 2020-11-29 DIAGNOSIS — I129 Hypertensive chronic kidney disease with stage 1 through stage 4 chronic kidney disease, or unspecified chronic kidney disease: Secondary | ICD-10-CM | POA: Diagnosis not present

## 2020-11-29 DIAGNOSIS — Z9181 History of falling: Secondary | ICD-10-CM | POA: Diagnosis not present

## 2020-11-29 DIAGNOSIS — R7303 Prediabetes: Secondary | ICD-10-CM | POA: Diagnosis not present

## 2020-11-29 DIAGNOSIS — K219 Gastro-esophageal reflux disease without esophagitis: Secondary | ICD-10-CM | POA: Diagnosis not present

## 2020-11-29 DIAGNOSIS — M810 Age-related osteoporosis without current pathological fracture: Secondary | ICD-10-CM | POA: Diagnosis not present

## 2020-11-29 DIAGNOSIS — I4821 Permanent atrial fibrillation: Secondary | ICD-10-CM | POA: Diagnosis not present

## 2020-11-29 DIAGNOSIS — Z780 Asymptomatic menopausal state: Secondary | ICD-10-CM | POA: Diagnosis not present

## 2020-11-29 DIAGNOSIS — Z8673 Personal history of transient ischemic attack (TIA), and cerebral infarction without residual deficits: Secondary | ICD-10-CM | POA: Diagnosis not present

## 2020-11-29 DIAGNOSIS — Z853 Personal history of malignant neoplasm of breast: Secondary | ICD-10-CM | POA: Diagnosis not present

## 2020-11-29 DIAGNOSIS — N189 Chronic kidney disease, unspecified: Secondary | ICD-10-CM | POA: Diagnosis not present

## 2020-12-16 ENCOUNTER — Telehealth: Payer: Self-pay | Admitting: Physician Assistant

## 2020-12-16 DIAGNOSIS — M818 Other osteoporosis without current pathological fracture: Secondary | ICD-10-CM | POA: Diagnosis not present

## 2020-12-16 DIAGNOSIS — Z85820 Personal history of malignant melanoma of skin: Secondary | ICD-10-CM | POA: Diagnosis not present

## 2020-12-16 DIAGNOSIS — L819 Disorder of pigmentation, unspecified: Secondary | ICD-10-CM | POA: Diagnosis not present

## 2020-12-16 NOTE — Telephone Encounter (Signed)
Dr. Feliciana Rossetti left a message on office voice mail saying that she was seeing patient today for her primary care physician.  Dr. Feliciana Rossetti satated that Patient has a new lesion that has popped up and she would like for patient to be seen.  (Patient was rescheduled from January 2023 to 01/18/2021 on 12/14/2020.)  There are no available appointments prior to that date at this time.  Dr. Feliciana Rossetti stated that she would like to speak with Robyne Askew, PA-C.  Dr. Mayme Genta Number is [phone].

## 2020-12-20 ENCOUNTER — Other Ambulatory Visit: Payer: Self-pay

## 2020-12-20 ENCOUNTER — Ambulatory Visit: Payer: Medicare Other | Admitting: Physician Assistant

## 2020-12-20 DIAGNOSIS — D485 Neoplasm of uncertain behavior of skin: Secondary | ICD-10-CM

## 2020-12-20 DIAGNOSIS — Z85820 Personal history of malignant melanoma of skin: Secondary | ICD-10-CM | POA: Diagnosis not present

## 2020-12-20 DIAGNOSIS — L720 Epidermal cyst: Secondary | ICD-10-CM

## 2020-12-21 ENCOUNTER — Encounter: Payer: Self-pay | Admitting: Physician Assistant

## 2020-12-21 NOTE — Progress Notes (Signed)
   Follow-Up Visit   Subjective  Marissa Garza is a 87 y.o. female who presents for the following: Skin Problem (Patient here today for lesion in her groin area x 1 month that was bleeding and her PCP wanted it checked by her Dermatologist. Personal history of melanoma).   The following portions of the chart were reviewed this encounter and updated as appropriate:  Tobacco  Allergies  Meds  Problems  Med Hx  Surg Hx  Fam Hx      Objective  Well appearing patient in no apparent distress; mood and affect are within normal limits.  A focused examination was performed including suprapubic area. Relevant physical exam findings are noted in the Assessment and Plan.  Pubic Black crusted plaque      Assessment & Plan  Neoplasm of uncertain behavior of skin Pubic  Skin / nail biopsy Type of biopsy: tangential   Informed consent: discussed and consent obtained   Timeout: patient name, date of birth, surgical site, and procedure verified   Anesthesia: the lesion was anesthetized in a standard fashion   Anesthetic:  1% lidocaine w/ epinephrine 1-100,000 local infiltration Instrument used: flexible razor blade   Hemostasis achieved with: aluminum chloride and electrodesiccation   Outcome: patient tolerated procedure well   Post-procedure details: wound care instructions given    Specimen 1 - Surgical pathology Differential Diagnosis: mm (STAT PER PROVIDER)  Check Margins: No    I, Mistina Coatney, PA-C, have reviewed all documentation's for this visit.  The documentation on 12/21/20 for the exam, diagnosis, procedures and orders are all accurate and complete.

## 2020-12-24 DIAGNOSIS — M9903 Segmental and somatic dysfunction of lumbar region: Secondary | ICD-10-CM | POA: Diagnosis not present

## 2020-12-24 DIAGNOSIS — M9902 Segmental and somatic dysfunction of thoracic region: Secondary | ICD-10-CM | POA: Diagnosis not present

## 2020-12-24 DIAGNOSIS — M9905 Segmental and somatic dysfunction of pelvic region: Secondary | ICD-10-CM | POA: Diagnosis not present

## 2020-12-24 DIAGNOSIS — M5431 Sciatica, right side: Secondary | ICD-10-CM | POA: Diagnosis not present

## 2020-12-29 DIAGNOSIS — M9905 Segmental and somatic dysfunction of pelvic region: Secondary | ICD-10-CM | POA: Diagnosis not present

## 2020-12-29 DIAGNOSIS — M9902 Segmental and somatic dysfunction of thoracic region: Secondary | ICD-10-CM | POA: Diagnosis not present

## 2020-12-29 DIAGNOSIS — M5431 Sciatica, right side: Secondary | ICD-10-CM | POA: Diagnosis not present

## 2020-12-29 DIAGNOSIS — M9903 Segmental and somatic dysfunction of lumbar region: Secondary | ICD-10-CM | POA: Diagnosis not present

## 2021-01-05 DIAGNOSIS — M9903 Segmental and somatic dysfunction of lumbar region: Secondary | ICD-10-CM | POA: Diagnosis not present

## 2021-01-05 DIAGNOSIS — M9902 Segmental and somatic dysfunction of thoracic region: Secondary | ICD-10-CM | POA: Diagnosis not present

## 2021-01-05 DIAGNOSIS — M9905 Segmental and somatic dysfunction of pelvic region: Secondary | ICD-10-CM | POA: Diagnosis not present

## 2021-01-05 DIAGNOSIS — M5431 Sciatica, right side: Secondary | ICD-10-CM | POA: Diagnosis not present

## 2021-01-07 DIAGNOSIS — R61 Generalized hyperhidrosis: Secondary | ICD-10-CM | POA: Diagnosis not present

## 2021-01-07 DIAGNOSIS — L819 Disorder of pigmentation, unspecified: Secondary | ICD-10-CM | POA: Diagnosis not present

## 2021-01-12 DIAGNOSIS — M9905 Segmental and somatic dysfunction of pelvic region: Secondary | ICD-10-CM | POA: Diagnosis not present

## 2021-01-12 DIAGNOSIS — M9902 Segmental and somatic dysfunction of thoracic region: Secondary | ICD-10-CM | POA: Diagnosis not present

## 2021-01-12 DIAGNOSIS — M5431 Sciatica, right side: Secondary | ICD-10-CM | POA: Diagnosis not present

## 2021-01-12 DIAGNOSIS — M9903 Segmental and somatic dysfunction of lumbar region: Secondary | ICD-10-CM | POA: Diagnosis not present

## 2021-01-18 ENCOUNTER — Ambulatory Visit: Payer: Medicare Other | Admitting: Physician Assistant

## 2021-01-19 DIAGNOSIS — M9902 Segmental and somatic dysfunction of thoracic region: Secondary | ICD-10-CM | POA: Diagnosis not present

## 2021-01-19 DIAGNOSIS — M5431 Sciatica, right side: Secondary | ICD-10-CM | POA: Diagnosis not present

## 2021-01-19 DIAGNOSIS — M9903 Segmental and somatic dysfunction of lumbar region: Secondary | ICD-10-CM | POA: Diagnosis not present

## 2021-01-19 DIAGNOSIS — M9905 Segmental and somatic dysfunction of pelvic region: Secondary | ICD-10-CM | POA: Diagnosis not present

## 2021-01-26 DIAGNOSIS — M9903 Segmental and somatic dysfunction of lumbar region: Secondary | ICD-10-CM | POA: Diagnosis not present

## 2021-01-26 DIAGNOSIS — M9905 Segmental and somatic dysfunction of pelvic region: Secondary | ICD-10-CM | POA: Diagnosis not present

## 2021-01-26 DIAGNOSIS — M5431 Sciatica, right side: Secondary | ICD-10-CM | POA: Diagnosis not present

## 2021-01-26 DIAGNOSIS — M9902 Segmental and somatic dysfunction of thoracic region: Secondary | ICD-10-CM | POA: Diagnosis not present

## 2021-02-16 DIAGNOSIS — M9903 Segmental and somatic dysfunction of lumbar region: Secondary | ICD-10-CM | POA: Diagnosis not present

## 2021-02-16 DIAGNOSIS — M5431 Sciatica, right side: Secondary | ICD-10-CM | POA: Diagnosis not present

## 2021-02-16 DIAGNOSIS — M9902 Segmental and somatic dysfunction of thoracic region: Secondary | ICD-10-CM | POA: Diagnosis not present

## 2021-02-16 DIAGNOSIS — M9905 Segmental and somatic dysfunction of pelvic region: Secondary | ICD-10-CM | POA: Diagnosis not present

## 2021-02-28 DIAGNOSIS — I4821 Permanent atrial fibrillation: Secondary | ICD-10-CM | POA: Diagnosis not present

## 2021-02-28 DIAGNOSIS — L0292 Furuncle, unspecified: Secondary | ICD-10-CM | POA: Diagnosis not present

## 2021-02-28 DIAGNOSIS — N816 Rectocele: Secondary | ICD-10-CM | POA: Diagnosis not present

## 2021-02-28 DIAGNOSIS — I69398 Other sequelae of cerebral infarction: Secondary | ICD-10-CM | POA: Diagnosis not present

## 2021-02-28 DIAGNOSIS — I10 Essential (primary) hypertension: Secondary | ICD-10-CM | POA: Diagnosis not present

## 2021-02-28 DIAGNOSIS — M5440 Lumbago with sciatica, unspecified side: Secondary | ICD-10-CM | POA: Diagnosis not present

## 2021-02-28 DIAGNOSIS — R269 Unspecified abnormalities of gait and mobility: Secondary | ICD-10-CM | POA: Diagnosis not present

## 2021-02-28 DIAGNOSIS — E782 Mixed hyperlipidemia: Secondary | ICD-10-CM | POA: Diagnosis not present

## 2021-02-28 DIAGNOSIS — I119 Hypertensive heart disease without heart failure: Secondary | ICD-10-CM | POA: Diagnosis not present

## 2021-03-02 DIAGNOSIS — M9902 Segmental and somatic dysfunction of thoracic region: Secondary | ICD-10-CM | POA: Diagnosis not present

## 2021-03-02 DIAGNOSIS — M5431 Sciatica, right side: Secondary | ICD-10-CM | POA: Diagnosis not present

## 2021-03-02 DIAGNOSIS — M9903 Segmental and somatic dysfunction of lumbar region: Secondary | ICD-10-CM | POA: Diagnosis not present

## 2021-03-02 DIAGNOSIS — L0232 Furuncle of buttock: Secondary | ICD-10-CM | POA: Diagnosis not present

## 2021-03-02 DIAGNOSIS — Z85820 Personal history of malignant melanoma of skin: Secondary | ICD-10-CM | POA: Diagnosis not present

## 2021-03-02 DIAGNOSIS — L0231 Cutaneous abscess of buttock: Secondary | ICD-10-CM | POA: Diagnosis not present

## 2021-03-02 DIAGNOSIS — M9905 Segmental and somatic dysfunction of pelvic region: Secondary | ICD-10-CM | POA: Diagnosis not present

## 2021-03-03 ENCOUNTER — Ambulatory Visit: Payer: Medicare Other | Admitting: Physician Assistant

## 2021-03-16 DIAGNOSIS — M9905 Segmental and somatic dysfunction of pelvic region: Secondary | ICD-10-CM | POA: Diagnosis not present

## 2021-03-16 DIAGNOSIS — M9903 Segmental and somatic dysfunction of lumbar region: Secondary | ICD-10-CM | POA: Diagnosis not present

## 2021-03-16 DIAGNOSIS — M9902 Segmental and somatic dysfunction of thoracic region: Secondary | ICD-10-CM | POA: Diagnosis not present

## 2021-03-16 DIAGNOSIS — M5431 Sciatica, right side: Secondary | ICD-10-CM | POA: Diagnosis not present

## 2021-03-23 DIAGNOSIS — M5431 Sciatica, right side: Secondary | ICD-10-CM | POA: Diagnosis not present

## 2021-03-23 DIAGNOSIS — M9902 Segmental and somatic dysfunction of thoracic region: Secondary | ICD-10-CM | POA: Diagnosis not present

## 2021-03-23 DIAGNOSIS — M9903 Segmental and somatic dysfunction of lumbar region: Secondary | ICD-10-CM | POA: Diagnosis not present

## 2021-03-23 DIAGNOSIS — M9905 Segmental and somatic dysfunction of pelvic region: Secondary | ICD-10-CM | POA: Diagnosis not present

## 2021-03-24 DIAGNOSIS — R0609 Other forms of dyspnea: Secondary | ICD-10-CM | POA: Diagnosis not present

## 2021-03-24 DIAGNOSIS — I119 Hypertensive heart disease without heart failure: Secondary | ICD-10-CM | POA: Diagnosis not present

## 2021-03-24 DIAGNOSIS — Z8673 Personal history of transient ischemic attack (TIA), and cerebral infarction without residual deficits: Secondary | ICD-10-CM | POA: Diagnosis not present

## 2021-03-24 DIAGNOSIS — R9431 Abnormal electrocardiogram [ECG] [EKG]: Secondary | ICD-10-CM | POA: Diagnosis not present

## 2021-03-24 DIAGNOSIS — I340 Nonrheumatic mitral (valve) insufficiency: Secondary | ICD-10-CM | POA: Diagnosis not present

## 2021-03-24 DIAGNOSIS — Z7901 Long term (current) use of anticoagulants: Secondary | ICD-10-CM | POA: Diagnosis not present

## 2021-03-24 DIAGNOSIS — I10 Essential (primary) hypertension: Secondary | ICD-10-CM | POA: Diagnosis not present

## 2021-03-24 DIAGNOSIS — Z79899 Other long term (current) drug therapy: Secondary | ICD-10-CM | POA: Diagnosis not present

## 2021-03-24 DIAGNOSIS — G459 Transient cerebral ischemic attack, unspecified: Secondary | ICD-10-CM | POA: Diagnosis not present

## 2021-03-24 DIAGNOSIS — I083 Combined rheumatic disorders of mitral, aortic and tricuspid valves: Secondary | ICD-10-CM | POA: Diagnosis not present

## 2021-03-24 DIAGNOSIS — I4821 Permanent atrial fibrillation: Secondary | ICD-10-CM | POA: Diagnosis not present

## 2021-03-24 DIAGNOSIS — I272 Pulmonary hypertension, unspecified: Secondary | ICD-10-CM | POA: Diagnosis not present

## 2021-04-06 DIAGNOSIS — M5431 Sciatica, right side: Secondary | ICD-10-CM | POA: Diagnosis not present

## 2021-04-06 DIAGNOSIS — M9905 Segmental and somatic dysfunction of pelvic region: Secondary | ICD-10-CM | POA: Diagnosis not present

## 2021-04-06 DIAGNOSIS — M9902 Segmental and somatic dysfunction of thoracic region: Secondary | ICD-10-CM | POA: Diagnosis not present

## 2021-04-06 DIAGNOSIS — M9903 Segmental and somatic dysfunction of lumbar region: Secondary | ICD-10-CM | POA: Diagnosis not present

## 2021-04-12 DIAGNOSIS — M170 Bilateral primary osteoarthritis of knee: Secondary | ICD-10-CM | POA: Diagnosis not present

## 2021-04-12 DIAGNOSIS — M7651 Patellar tendinitis, right knee: Secondary | ICD-10-CM | POA: Diagnosis not present

## 2021-04-12 DIAGNOSIS — M7652 Patellar tendinitis, left knee: Secondary | ICD-10-CM | POA: Diagnosis not present

## 2021-04-20 ENCOUNTER — Ambulatory Visit: Payer: Medicare Other | Admitting: Physician Assistant

## 2021-05-04 DIAGNOSIS — M9903 Segmental and somatic dysfunction of lumbar region: Secondary | ICD-10-CM | POA: Diagnosis not present

## 2021-05-04 DIAGNOSIS — M5431 Sciatica, right side: Secondary | ICD-10-CM | POA: Diagnosis not present

## 2021-05-04 DIAGNOSIS — M9902 Segmental and somatic dysfunction of thoracic region: Secondary | ICD-10-CM | POA: Diagnosis not present

## 2021-05-04 DIAGNOSIS — M9905 Segmental and somatic dysfunction of pelvic region: Secondary | ICD-10-CM | POA: Diagnosis not present

## 2021-05-30 DIAGNOSIS — Z79899 Other long term (current) drug therapy: Secondary | ICD-10-CM | POA: Diagnosis not present

## 2021-05-30 DIAGNOSIS — M810 Age-related osteoporosis without current pathological fracture: Secondary | ICD-10-CM | POA: Diagnosis not present

## 2021-05-30 DIAGNOSIS — Z9181 History of falling: Secondary | ICD-10-CM | POA: Diagnosis not present

## 2021-05-30 DIAGNOSIS — Z7901 Long term (current) use of anticoagulants: Secondary | ICD-10-CM | POA: Diagnosis not present

## 2021-05-30 DIAGNOSIS — R2689 Other abnormalities of gait and mobility: Secondary | ICD-10-CM | POA: Diagnosis not present

## 2021-05-30 DIAGNOSIS — M545 Low back pain, unspecified: Secondary | ICD-10-CM | POA: Diagnosis not present

## 2021-05-30 DIAGNOSIS — I4811 Longstanding persistent atrial fibrillation: Secondary | ICD-10-CM | POA: Diagnosis not present

## 2021-05-30 DIAGNOSIS — M170 Bilateral primary osteoarthritis of knee: Secondary | ICD-10-CM | POA: Diagnosis not present

## 2021-06-01 DIAGNOSIS — M5431 Sciatica, right side: Secondary | ICD-10-CM | POA: Diagnosis not present

## 2021-06-01 DIAGNOSIS — M9902 Segmental and somatic dysfunction of thoracic region: Secondary | ICD-10-CM | POA: Diagnosis not present

## 2021-06-01 DIAGNOSIS — M9903 Segmental and somatic dysfunction of lumbar region: Secondary | ICD-10-CM | POA: Diagnosis not present

## 2021-06-01 DIAGNOSIS — M9905 Segmental and somatic dysfunction of pelvic region: Secondary | ICD-10-CM | POA: Diagnosis not present
